# Patient Record
Sex: Female | Born: 1995 | Race: White | Hispanic: No | Marital: Single | Smoking: Never smoker
Health system: Southern US, Community
[De-identification: ages and names within clinical notes are randomized; demographics above are authoritative.]

## PROBLEM LIST (undated history)

## (undated) DIAGNOSIS — F32A Depression, unspecified: Secondary | ICD-10-CM

## (undated) DIAGNOSIS — R55 Syncope and collapse: Secondary | ICD-10-CM

## (undated) DIAGNOSIS — F419 Anxiety disorder, unspecified: Secondary | ICD-10-CM

## (undated) DIAGNOSIS — N200 Calculus of kidney: Secondary | ICD-10-CM

## (undated) DIAGNOSIS — F329 Major depressive disorder, single episode, unspecified: Secondary | ICD-10-CM

## (undated) DIAGNOSIS — N3020 Other chronic cystitis without hematuria: Secondary | ICD-10-CM

## (undated) DIAGNOSIS — N390 Urinary tract infection, site not specified: Secondary | ICD-10-CM

## (undated) HISTORY — DX: Syncope and collapse: R55

## (undated) HISTORY — DX: Major depressive disorder, single episode, unspecified: F32.9

## (undated) HISTORY — DX: Anxiety disorder, unspecified: F41.9

## (undated) HISTORY — DX: Calculus of kidney: N20.0

## (undated) HISTORY — DX: Depression, unspecified: F32.A

## (undated) HISTORY — DX: Urinary tract infection, site not specified: N39.0

## (undated) HISTORY — PX: NO PAST SURGERIES: SHX2092

---

## 2017-04-30 NOTE — Progress Notes (Signed)
05/01/2017 3:47 PM   Christine Farley 1995/11/29 [id number]  Referring provider: [name], MD [address] [place], Christine Farley [id number]  Chief Complaint  Patient presents with  . Recurrent UTI    new patient referred by Dr. Kary Farley    HPI: Patient is a 21 -year-old Caucasian female who is referred to [place] by, Dr. Maryland Farley, for recurrent urinary tract infections.  Patient states that she has had 6 to 8 urinary tract infections over the last year.  She states she had frequent UTI's since the age of 41.    Reviewing her records,  she has had she has not had any documented positive urine cultures.   Her symptoms with a urinary tract infection consist of blood in the urine, dysuria and urgency.  She is drinking 8 eight ounces of water daily.  She is still having dysuria and urgency, but the hematuria has cleared.    Today, she is still frequency, dysuria, incontinence and urgency.  She denies gross hematuria, suprapubic pain, back pain, abdominal pain or flank pain.  She has not had any recent fevers, chills, nausea or vomiting.  Her PVR is 0 mL.    She has a history of nephrolithiasis.  She spontaneously passed the stone.  She was 21 years of age.  She did not have imaging study and never captured a fragment.  She had not had any GU surgery or GU trauma.   She is sexually active.  She has noted a correlation with her urinary tract infections and sexual intercourse.   She does not engage in anal sex.  She is voiding before and after sex.   She is not postmenopausal.   She admits to constipation.  She does use tampons.  She does not engage in good perineal hygiene. She does take tub baths on occasion.    She had incontinence one time.    She is not having pain with bladder filling.    She has not had any recent imaging studies.   She did state she had an ultrasound of her pelvis recently and it was normal.  This is with her gynecologist in [name].    She is not  drinking sodas or juices.     Reviewed referral notes.    PMH: Past Medical History:  Diagnosis Date  . Anxiety   . Depression   . Fainting episodes   . Kidney stone   . Recurrent UTI     Surgical History: History reviewed. No pertinent surgical history.  Home Medications:  Allergies as of 05/01/2017      Reactions   Nsaids Anaphylaxis      Medication List       Accurate as of 05/01/17  3:47 PM. Always use your most recent med list.          ciprofloxacin 250 MG tablet Commonly known as:  CIPRO Take by mouth.   phenazopyridine 200 MG tablet Commonly known as:  PYRIDIUM Take by mouth.   PROAIR HFA 108 (90 Base) MCG/ACT inhaler Generic drug:  albuterol       Allergies:  Allergies  Allergen Reactions  . Nsaids Anaphylaxis    Family History: Family History  Problem Relation Age of Onset  . Kidney cancer Neg Hx   . Bladder Cancer Neg Hx     Social History:  reports that she has never smoked. She has never used smokeless tobacco. She reports that she drinks alcohol. She reports that  she does not use drugs.  ROS: UROLOGY Frequent Urination?: Yes Hard to postpone urination?: Yes Burning/pain with urination?: Yes Get up at night to urinate?: No Leakage of urine?: Yes Urine stream starts and stops?: No Trouble starting stream?: No Do you have to strain to urinate?: No Blood in urine?: No Urinary tract infection?: Yes Sexually transmitted disease?: No Injury to kidneys or bladder?: No Painful intercourse?: No Weak stream?: No Currently pregnant?: No Vaginal bleeding?: No Last menstrual period?: 04/13/2017  Gastrointestinal Nausea?: Yes Vomiting?: No Indigestion/heartburn?: No Diarrhea?: No Constipation?: Yes  Constitutional Fever: No Night sweats?: No Weight loss?: No Fatigue?: Yes  Skin Skin rash/lesions?: No Itching?: No  Eyes Blurred vision?: No Double vision?: No  Ears/Nose/Throat Sore throat?: No Sinus problems?:  No  Hematologic/Lymphatic Swollen glands?: No Easy bruising?: No  Cardiovascular Leg swelling?: No Chest pain?: No  Respiratory Cough?: No Shortness of breath?: Yes  Endocrine Excessive thirst?: No  Musculoskeletal Back pain?: Yes Joint pain?: Yes  Neurological Headaches?: Yes Dizziness?: Yes  Psychologic Depression?: Yes Anxiety?: Yes  Physical Exam: BP 109/68   Pulse 72   Ht 5' 3"  (1.6 m)   Wt 138 lb 12.8 oz (63 kg)   LMP 04/13/2017   BMI 24.59 kg/m   Constitutional: Well nourished. Alert and oriented, No acute distress. HEENT: [place] AT, moist mucus membranes. Trachea midline, no masses. Cardiovascular: No clubbing, cyanosis, or edema. Respiratory: Normal respiratory effort, no increased work of breathing. GI: Abdomen is soft, non tender, non distended, no abdominal masses. Liver and spleen not palpable.  No hernias appreciated.  Stool sample for occult testing is not indicated.   GU: No CVA tenderness.  No bladder fullness or masses.  Normal external genitalia, normal pubic hair distribution, no lesions.  Normal urethral meatus, no lesions, no prolapse, no discharge.   No urethral masses, tenderness and/or tenderness. No bladder fullness, tenderness or masses. Normal vagina mucosa, good estrogen effect, no discharge, no lesions, good pelvic support, no cystocele or rectocele noted.  No cervical motion tenderness.  Uterus is freely mobile and non-fixed.  No adnexal/parametria masses or tenderness noted.  Anus and perineum are without rashes or lesions.    Skin: No rashes, bruises or suspicious lesions. Lymph: No cervical or inguinal adenopathy. Neurologic: Grossly intact, no focal deficits, moving all 4 extremities. Psychiatric: Normal mood and affect.  Pertinent Imaging: Results for Christine, Farley (MRN [id number]) as of 05/01/2017 15:44  Ref. Range 05/01/2017 15:26  Scan Result Unknown 0   I have independently reviewed the films.    Assessment & Plan:    1.  Dysuria  - criteria for recurrent UTI has not been met with 2 or more infections in 6 months or 3 or greater infections in one year   - Patient is instructed to increase their water intake until the urine is pale yellow or clear (10 to 12 cups daily) - patient drinking adequate fluids  - probiotics (yogurt, oral pills or vaginal suppositories), take cranberry pills or drink the juice and Vitamin C 1,000 mg daily to acidify the urine should be added to their daily regimen   - if using tampons, she should remove them prior to urinating and change them often   - avoid soaking in tubs and wipe front to back after urinating   - advised them to have CATH UA's for urinalysis and culture to prevent skin contamination of the specimen  - reviewed symptoms of UTI and advised not to have urine checked or be treated  for UTI if not experiencing symptoms  - discussed antibiotic stewardship with the patient    2. Gross hematuria  - will obtain a RUS to evaluate kidney status  - RTC for report  3. History of nephrolithiasis  - ? Of stone as patient was very young, there were no imaging studies performed and she did not capture a fragment  - RUS pending  4. Constipation  - Patient states that it only occurs during urinary tract infections - I do not have any positive urinary cultures available to me at this time and it is unsure at this time with patient is suffering from recurrent urinary tract infection or some other urinary issues                                              Return for RUS report.  These notes generated with voice recognition software. I apologize for typographical errors.  Zara Council, [facility] [address] [place], [place] [address] [phone]

## 2017-05-01 ENCOUNTER — Telehealth: Payer: Self-pay | Admitting: Urology

## 2017-05-01 ENCOUNTER — Encounter: Payer: Self-pay | Admitting: Urology

## 2017-05-01 ENCOUNTER — Ambulatory Visit (INDEPENDENT_AMBULATORY_CARE_PROVIDER_SITE_OTHER): Payer: BLUE CROSS/BLUE SHIELD | Admitting: Urology

## 2017-05-01 VITALS — BP 109/68 | HR 72 | Ht 63.0 in | Wt 138.8 lb

## 2017-05-01 DIAGNOSIS — R300 Dysuria: Secondary | ICD-10-CM | POA: Diagnosis not present

## 2017-05-01 DIAGNOSIS — R310 Gross hematuria: Secondary | ICD-10-CM

## 2017-05-01 DIAGNOSIS — K5909 Other constipation: Secondary | ICD-10-CM

## 2017-05-01 DIAGNOSIS — Z87442 Personal history of urinary calculi: Secondary | ICD-10-CM

## 2017-05-01 LAB — BLADDER SCAN AMB NON-IMAGING: Scan Result: 0

## 2017-05-01 NOTE — Telephone Encounter (Signed)
Would you send note to Dr. Burnett ShengHedrick?

## 2017-05-13 ENCOUNTER — Ambulatory Visit
Admission: RE | Admit: 2017-05-13 | Discharge: 2017-05-13 | Disposition: A | Discharge status: Home / Self Care | Payer: BLUE CROSS/BLUE SHIELD | Source: Ambulatory Visit | Attending: Urology | Admitting: Urology

## 2017-05-13 DIAGNOSIS — R310 Gross hematuria: Secondary | ICD-10-CM

## 2017-05-22 NOTE — Progress Notes (Signed)
05/23/2017 7:59 AM   Rafael Bihari [birth year] [id number]  Referring provider: No referring provider defined for this encounter.  Chief Complaint  Patient presents with  . Nephrolithiasis  . Dysuria    HPI: 21 yo WF with a history of UTI symptoms who presents today to discuss her RUS results.    Background history Patient is a 33 -year-old Caucasian female who is referred to [place] by, Dr. [name], for recurrent urinary tract infections.  Patient states that she has had 6 to 8 urinary tract infections over the last year.  She states she had frequent UTI's since the age of 40.   Reviewing her records,  she has had she has not had any documented positive urine cultures.  Her symptoms with a urinary tract infection consist of blood in the urine, dysuria and urgency.  She is drinking 8 eight ounces of water daily.  She is still having dysuria and urgency, but the hematuria has cleared.  Today, she is still frequency, dysuria, incontinence and urgency.  She denies gross hematuria, suprapubic pain, back pain, abdominal pain or flank pain.  She has not had any recent fevers, chills, nausea or vomiting.  Her PVR is 0 mL.  She has a history of nephrolithiasis.  She spontaneously passed the stone.  She was 21 years of age.  She did not have imaging study and never captured a fragment.  She had not had any GU surgery or GU trauma.  She is sexually active.  She has noted a correlation with her urinary tract infections and sexual intercourse.   She does not engage in anal sex.  She is voiding before and after sex.   She is not postmenopausal.   She admits to constipation.  She does use tampons.  She does not engage in good perineal hygiene. She does take tub baths on occasion.  She had incontinence one time.  She is not having pain with bladder filling.   She has not had any recent imaging studies.   She did state she had an ultrasound of her pelvis recently and it was normal.  This is with her gynecologist  in [name].   She is not drinking sodas or juices.    She was seen at [facility] urgent care on Monday for gross hematuria, dysuria and low back pain.  UA was positive for 4-10 WBC's and 4-10 RBC's.  Urine culture was positive for mixed urogenital flora.   Currently on Macrobid.    She is still experiencing dysuria and back pain.  She denies fevers, chills, nausea or vomiting.  Her UA today is negative.    RUS performed on 05/14/2017 noted normal ultrasound appearance of both kidneys and the urinary bladder.    PMH: Past Medical History:  Diagnosis Date  . Anxiety   . Depression   . Fainting episodes   . Kidney stone   . Recurrent UTI     Surgical History: History reviewed. No pertinent surgical history.  Home Medications:  Allergies as of 05/23/2017      Reactions   Nsaids Anaphylaxis      Medication List        Accurate as of 05/23/17 11:59 PM. Always use your most recent med list.          nitrofurantoin (macrocrystal-monohydrate) 100 MG capsule Commonly known as:  MACROBID Take 100 mg 2 (two) times daily by mouth.   phenazopyridine 200 MG tablet Commonly known as:  PYRIDIUM Take 200  mg 3 (three) times daily with meals by mouth.   PROAIR HFA 108 (90 Base) MCG/ACT inhaler Generic drug:  albuterol Inhale 2 puffs every 6 (six) hours as needed into the lungs.       Allergies:  Allergies  Allergen Reactions  . Nsaids Anaphylaxis    Family History: Family History  Problem Relation Age of Onset  . Kidney cancer Neg Hx   . Bladder Cancer Neg Hx     Social History:  reports that  has never smoked. she has never used smokeless tobacco. She reports that she drinks alcohol. She reports that she does not use drugs.  ROS: UROLOGY Frequent Urination?: No Hard to postpone urination?: No Burning/pain with urination?: Yes Get up at night to urinate?: No Leakage of urine?: No Urine stream starts and stops?: No Trouble starting stream?: No Do you have to strain to urinate?:  No Blood in urine?: No Urinary tract infection?: Yes Sexually transmitted disease?: No Injury to kidneys or bladder?: No Painful intercourse?: No Weak stream?: No Currently pregnant?: No Vaginal bleeding?: No  Gastrointestinal Nausea?: No Vomiting?: No Indigestion/heartburn?: No Diarrhea?: No Constipation?: No  Constitutional Fever: No Night sweats?: No Weight loss?: No Fatigue?: No  Skin Skin rash/lesions?: No Itching?: No  Eyes Blurred vision?: No Double vision?: No  Ears/Nose/Throat Sore throat?: No Sinus problems?: No  Hematologic/Lymphatic Swollen glands?: No Easy bruising?: No  Cardiovascular Leg swelling?: No Chest pain?: No  Respiratory Cough?: No Shortness of breath?: No  Endocrine Excessive thirst?: No  Musculoskeletal Back pain?: Yes Joint pain?: No  Neurological Headaches?: No Dizziness?: No  Psychologic Depression?: No Anxiety?: No  Physical Exam: BP 115/68   Pulse 71   Ht 5' 3"  (1.6 m)   Wt 137 lb 4.8 oz (62.3 kg)   BMI 24.32 kg/m   Constitutional: Well nourished. Alert and oriented, No acute distress. HEENT: [place] AT, moist mucus membranes. Trachea midline, no masses. Cardiovascular: No clubbing, cyanosis, or edema. Respiratory: Normal respiratory effort, no increased work of breathing. GI: Abdomen is soft, non tender, non distended, no abdominal masses. Liver and spleen not palpable.  No hernias appreciated.  Stool sample for occult testing is not indicated.   GU: No CVA tenderness.  No bladder fullness or masses.  Normal external genitalia, normal pubic hair distribution, no lesions.  Normal urethral meatus, no lesions, no prolapse, no discharge.   No urethral masses, tenderness and/or tenderness. No bladder fullness, tenderness or masses. Normal vagina mucosa, good estrogen effect, no discharge, no lesions, good pelvic support, no cystocele or rectocele noted.  No cervical motion tenderness.  Uterus is freely mobile and  non-fixed.  No adnexal/parametria masses or tenderness noted.  Anus and perineum are without rashes or lesions.    Skin: No rashes, bruises or suspicious lesions. Lymph: No cervical or inguinal adenopathy. Neurologic: Grossly intact, no focal deficits, moving all 4 extremities. Psychiatric: Normal mood and affect.  Pertinent Imaging: Results for SALAYA, HOLTROP (MRN [id number]) as of 05/01/2017 15:44  Ref. Range 05/01/2017 15:26  Scan Result Unknown 0   CLINICAL DATA:  21 year old female with gross hematuria, frequent urinary tract infection.  EXAM: RENAL / URINARY TRACT ULTRASOUND COMPLETE  COMPARISON:  None.  FINDINGS: Right Kidney:  Length: 10.6 cm. Echogenicity within normal limits. No mass or hydronephrosis visualized.  Left Kidney:  Length: 10.5 cm. Echogenicity within normal limits. No mass or hydronephrosis visualized.  Bladder:  Appears normal for degree of bladder distention. Both ureteral jets detected with Doppler.  IMPRESSION: Normal  ultrasound appearance of both kidneys and the urinary bladder.   Electronically Signed   By: Genevie Ann M.D.   On: 05/14/2017 07:58  I have independently reviewed the films.    Assessment & Plan:    1. Dysuria  - criteria for recurrent UTI has not been met with 2 or more infections in 6 months or 3 or greater infections in one year   - Patient is instructed to increase their water intake until the urine is pale yellow or clear (10 to 12 cups daily) - patient drinking adequate fluids  - probiotics (yogurt, oral pills or vaginal suppositories), take cranberry pills or drink the juice and Vitamin C 1,000 mg daily to acidify the urine should be added to their daily regimen   - if using tampons, she should remove them prior to urinating and change them often   - avoid soaking in tubs and wipe front to back after urinating   - advised them to have CATH UA's for urinalysis and culture to prevent skin contamination of the  specimen  - reviewed symptoms of UTI and advised not to have urine checked or be treated for UTI if not experiencing symptoms  - discussed antibiotic stewardship with the patient    - urine sent for repeat culture as patient is still having symptoms   - GC/Chlamydia cultures are sent  2. Gross hematuria  - RUS did not demonstrate an etiology for her hematuria or other urinary symptoms   3. History of nephrolithiasis  - no stone or hydronephrosis seen on RUS  4. Constipation  - Patient states that it only occurs during urinary tract infections - I do not have any positive urinary cultures available to me at this time and it is unsure at this time with patient is suffering from recurrent urinary tract infection or some other urinary issues                                   Return for pending cultures.  These notes generated with voice recognition software. I apologize for typographical errors.  Zara Council, [facility] [address] [place], [place] [address] [phone]

## 2017-05-23 ENCOUNTER — Ambulatory Visit (INDEPENDENT_AMBULATORY_CARE_PROVIDER_SITE_OTHER): Payer: BLUE CROSS/BLUE SHIELD | Admitting: Urology

## 2017-05-23 ENCOUNTER — Encounter: Payer: Self-pay | Admitting: Urology

## 2017-05-23 VITALS — BP 115/68 | HR 71 | Ht 63.0 in | Wt 137.3 lb

## 2017-05-23 DIAGNOSIS — K5909 Other constipation: Secondary | ICD-10-CM | POA: Diagnosis not present

## 2017-05-23 DIAGNOSIS — R310 Gross hematuria: Secondary | ICD-10-CM

## 2017-05-23 DIAGNOSIS — R300 Dysuria: Secondary | ICD-10-CM | POA: Diagnosis not present

## 2017-05-23 DIAGNOSIS — Z87442 Personal history of urinary calculi: Secondary | ICD-10-CM

## 2017-05-24 LAB — URINALYSIS, COMPLETE
BILIRUBIN UA: NEGATIVE
Glucose, UA: NEGATIVE
KETONES UA: NEGATIVE
Nitrite, UA: NEGATIVE
PROTEIN UA: NEGATIVE
RBC UA: NEGATIVE
SPEC GRAV UA: 1.01 (ref 1.005–1.030)
UUROB: 0.2 mg/dL (ref 0.2–1.0)
pH, UA: 6 (ref 5.0–7.5)

## 2017-05-24 LAB — MICROSCOPIC EXAMINATION: RBC, UA: NONE SEEN /hpf (ref 0–?)

## 2017-05-26 LAB — CULTURE, URINE COMPREHENSIVE

## 2017-05-26 LAB — GC/CHLAMYDIA PROBE AMP
Chlamydia trachomatis, NAA: NEGATIVE
Neisseria gonorrhoeae by PCR: NEGATIVE

## 2017-05-29 ENCOUNTER — Telehealth: Payer: Self-pay | Admitting: Family Medicine

## 2017-05-29 MED ORDER — OXYBUTYNIN CHLORIDE ER 10 MG PO TB24: 10.0000 mg | ORAL_TABLET | Freq: Every day | ORAL | 3 refills | Status: AC

## 2017-05-29 NOTE — Telephone Encounter (Signed)
I am hoping the Ditropan will relieve her symptoms of frequency, dysuria, incontinence and urgency.

## 2017-05-29 NOTE — Telephone Encounter (Signed)
Left message for patient to pick up medication from pharmacy and to call back to schedule [facility].

## 2017-05-29 NOTE — Telephone Encounter (Signed)
Spoke with pt in reference to ucx results and ditropan. Pt inquired about why she needs the ditropan. I did not see in dictation. Please advise.

## 2017-05-29 NOTE — Telephone Encounter (Signed)
-----   Message from Harle BattiestShannon A McGowan, PA-C sent at 05/28/2017 10:16 PM EST ----- Please let Christine Farley know that her urine cultures were negative.  I would like her to start on Ditropan XL 10 mg daily.  I will need to see her back in one month.

## 2017-05-30 ENCOUNTER — Telehealth: Payer: Self-pay | Admitting: Urology

## 2017-05-30 NOTE — Telephone Encounter (Signed)
Spoke with pt in reference to taking ditropan for frequency, urgency, dysuria, and incontinence. Pt then stated that she still doesn't understand what the medicine is for. Once again reinforced with pt the purpose of medication and her reason for [facility]. Pt stated that she doesn't have any issues unless she has a UTI. Once again reinforced with pt that she does not/did not bring UTI documentation with her to appt therefore [facility] is tx off of her c/o s/s. Pt stated that she would call back for an appt.

## 2017-05-30 NOTE — Telephone Encounter (Signed)
Left message for patient to call back so that I could answer her questions.

## 2017-06-17 ENCOUNTER — Other Ambulatory Visit: Payer: Self-pay

## 2017-06-19 ENCOUNTER — Encounter: Payer: Self-pay | Admitting: Urology

## 2017-06-19 ENCOUNTER — Ambulatory Visit (INDEPENDENT_AMBULATORY_CARE_PROVIDER_SITE_OTHER): Payer: BLUE CROSS/BLUE SHIELD | Admitting: Urology

## 2017-06-19 VITALS — BP 129/75 | HR 67 | Ht 63.0 in | Wt 130.0 lb

## 2017-06-19 DIAGNOSIS — R310 Gross hematuria: Secondary | ICD-10-CM | POA: Diagnosis not present

## 2017-06-19 DIAGNOSIS — R300 Dysuria: Secondary | ICD-10-CM

## 2017-06-19 DIAGNOSIS — Z87442 Personal history of urinary calculi: Secondary | ICD-10-CM

## 2017-06-19 MED ORDER — CEPHALEXIN 250 MG PO CAPS: ORAL_CAPSULE | ORAL | 0 refills | Status: AC

## 2017-06-19 NOTE — Progress Notes (Addendum)
06/19/2017 5:27 PM   Christine OrleansLexi B Mcnulty 21-Jul-1995 [id number]  Referring provider: No referring provider defined for this encounter.  Chief Complaint  Patient presents with  . Dysuria  . Nephrolithiasis    HPI: 21 yo WF with a history of UTI symptoms who presents today for follow up after being seen at Urgent care for UTI.    Background history Patient is a 21 -year-old Caucasian female who is referred to us by, Dr. Jerl MinaJames Hedrick, for recurrent urinary tract infections.  Patient states that she has had 6 to 8 urinary tract infections over the last year.  She states she had frequent UTI's since the age of 89.   Reviewing her records,  she has had she has not had any documented positive urine cultures.  Her symptoms with a urinary tract infection consist of blood in the urine, dysuria and urgency.  She is drinking 8 eight ounces of water daily.  She is still having dysuria and urgency, but the hematuria has cleared.  Today, she is still frequency, dysuria, incontinence and urgency.  She denies gross hematuria, suprapubic pain, back pain, abdominal pain or flank pain.  She has not had any recent fevers, chills, nausea or vomiting.  Her PVR is 0 mL.  She has a history of nephrolithiasis.  She spontaneously passed the stone.  She was 21 years of age.  She did not have imaging study and never captured a fragment.  She had not had any GU surgery or GU trauma.  She is sexually active.  She has noted a correlation with her urinary tract infections and sexual intercourse.   She does not engage in anal sex.  She is voiding before and after sex.   She is not postmenopausal.   She admits to constipation.  She does use tampons.  She does not engage in good perineal hygiene. She does take tub baths on occasion.  She had incontinence one time.  She is not having pain with bladder filling.   She has not had any recent imaging studies.   She did state she had an ultrasound of her pelvis recently and it was normal.   This is with her gynecologist in [place].   She is not drinking sodas or juices.    [facility]/2017 for symptoms of gross hematuria, dysuria and low back pain.  Placed on Septra.  Urine culture was positive for mixed urogenital flora.   She was seen [facility] for symptoms of dysuria.  She was placed on Cipro.  UA was positive for 4-10 WBC's, 4-10 RBC and a few bacteria.  Urine culture was negative.   She was seen again on 05/20/2017 for hematuria and low back pain.  Stared on Macrobid.  UA was positive for 4-10 WBC's and 4-10 RBC's.  Her urine culture was negative.   She then [facility] for increased frequency, urgency and suprapubic pain.  UA was nitrite positive, 10-50 WBC's, 4-10 RBC's and many bacteria.  Urine culture was negative.    Her UA, GC and Chlamydia were negative on 05/23/2017.   RUS performed on 05/14/2017 noted normal ultrasound appearance of both kidneys and the urinary bladder.  Today, she is experiencing frequency, urgency, dysuria and nocturia.  She is not having gross hematuria, suprapubic pain or flank pain.  She is not having fevers, chills, nausea or vomiting.  Her CATH UA in our  office is positive for 11-30 WBC's.  She will be leaving for Christmas break in two weeks and then out of the country in late February for four months.      PMH: Past Medical History:  Diagnosis Date  . Anxiety   . Depression   . Fainting episodes   . Kidney stone   . Recurrent UTI     Surgical History: Past Surgical History:  Procedure Laterality Date  . NO PAST SURGERIES      Home Medications:  Allergies as of 06/19/2017      Reactions   Nsaids Anaphylaxis      Medication List        Accurate as of 06/19/17 11:59 PM. Always use your most recent med list.          cephALEXin 250 MG capsule Commonly known as:  KEFLEX Take one capsule after intercourse   oxybutynin 10 MG 24 hr tablet Commonly  known as:  DITROPAN-XL Take 1 tablet (10 mg total) daily by mouth.   PROAIR HFA 108 (90 Base) MCG/ACT inhaler Generic drug:  albuterol Inhale 2 puffs every 6 (six) hours as needed into the lungs.       Allergies:  Allergies  Allergen Reactions  . Nsaids Anaphylaxis    Family History: Family History  Problem Relation Age of Onset  . Healthy Father   . Healthy Mother   . Kidney cancer Neg Hx   . Bladder Cancer Neg Hx     Social History:  reports that  has never smoked. she has never used smokeless tobacco. She reports that she drinks alcohol. She reports that she does not use drugs.  ROS: UROLOGY Frequent Urination?: Yes Hard to postpone urination?: Yes Burning/pain with urination?: Yes Get up at night to urinate?: Yes Leakage of urine?: No Urine stream starts and stops?: No Trouble starting stream?: No Do you have to strain to urinate?: No Blood in urine?: No Urinary tract infection?: No Sexually transmitted disease?: No Injury to kidneys or bladder?: No Painful intercourse?: No Weak stream?: No Currently pregnant?: No Vaginal bleeding?: No Last menstrual period?: 06/11/17  Gastrointestinal Nausea?: No Vomiting?: No Indigestion/heartburn?: No Diarrhea?: No Constipation?: No  Constitutional Fever: No Night sweats?: No Weight loss?: No Fatigue?: No  Skin Skin rash/lesions?: No Itching?: No  Eyes Blurred vision?: No Double vision?: No  Ears/Nose/Throat Sore throat?: No Sinus problems?: No  Hematologic/Lymphatic Swollen glands?: No Easy bruising?: No  Cardiovascular Leg swelling?: No Chest pain?: No  Respiratory Cough?: No Shortness of breath?: No  Endocrine Excessive thirst?: Yes  Musculoskeletal Back pain?: No Joint pain?: No  Neurological Headaches?: No Dizziness?: No  Psychologic Depression?: No Anxiety?: No  Physical Exam: BP 129/75   Pulse 67   Ht 5\' 3"  (1.6 m)   Wt 130 lb (59 kg)   LMP 06/10/2017 (Exact Date)    BMI 23.03 kg/m   Constitutional: Well nourished. Alert and oriented, No acute distress. HEENT: [place] AT, moist mucus membranes. Trachea midline, no masses. Cardiovascular: No clubbing, cyanosis, or edema. Respiratory: Normal respiratory effort, no increased work of breathing. Skin: No rashes, bruises or suspicious lesions. Lymph: No cervical or inguinal adenopathy. Neurologic: Grossly intact, no focal deficits, moving all 4 extremities. Psychiatric: Normal mood and affect.  Laboratory Data Urinalysis CATH UA was positive for 11-30 WBC's.  See Epic.   Pertinent Imaging: Results for Christine Farley, Christine Farley (MRN [id number]) as of 05/01/2017 15:44  Ref. Range 05/01/2017 15:26  Scan Result Unknown 0   CLINICAL  DATA:  21 year old female with gross hematuria, frequent urinary tract infection.  EXAM: RENAL / URINARY TRACT ULTRASOUND COMPLETE  COMPARISON:  None.  FINDINGS: Right Kidney:  Length: 10.6 cm. Echogenicity within normal limits. No mass or hydronephrosis visualized.  Left Kidney:  Length: 10.5 cm. Echogenicity within normal limits. No mass or hydronephrosis visualized.  Bladder:  Appears normal for degree of bladder distention. Both ureteral jets detected with Doppler.  IMPRESSION: Normal ultrasound appearance of both kidneys and the urinary bladder.   Electronically Signed   By: Odessa Fleming M.D.   On: 05/14/2017 07:58  I have independently reviewed the films.    Assessment & Plan:    1. Dysuria  - UA is suspicious for infection - will send for culture  - Keflex is given for post coital prophylaxis.    2. Gross hematuria  - RUS did not demonstrate an etiology for her hematuria or other urinary symptoms  - Patient has experienced hematuria with negative cultures -continues to have UTI symptoms  - obtain CT Renal stone study as RUS can miss stones  3. History of nephrolithiasis  - no stone or hydronephrosis seen on RUS  - CT Renal stone study pending                                     Return for I will call patient with results.  These notes generated with voice recognition software. I apologize for typographical errors.  Cloretta Ned  [facility] [address], [address] [place], [place] [address] [phone]  Addendum:   Patient and her father, who is a physician, did not want her to have a CT scan.  KUB and RUS were negative.   Currently on post-coital antibiotic but still having UTI symptoms after intercourse.  She will be scheduled for a cystoscopy for further evaluation of her pain.

## 2017-06-20 LAB — URINALYSIS, COMPLETE
BILIRUBIN UA: NEGATIVE
Glucose, UA: NEGATIVE
Ketones, UA: NEGATIVE
Nitrite, UA: NEGATIVE
PH UA: 7 (ref 5.0–7.5)
PROTEIN UA: NEGATIVE
Specific Gravity, UA: 1.01 (ref 1.005–1.030)
Urobilinogen, Ur: 0.2 mg/dL (ref 0.2–1.0)

## 2017-06-20 LAB — MICROSCOPIC EXAMINATION: EPITHELIAL CELLS (NON RENAL): NONE SEEN /HPF (ref 0–10)

## 2017-06-21 ENCOUNTER — Telehealth: Payer: Self-pay

## 2017-06-21 MED ORDER — CEPHALEXIN 250 MG PO CAPS: ORAL_CAPSULE | ORAL | 0 refills | Status: DC

## 2017-06-21 NOTE — Telephone Encounter (Signed)
LMOM- telling pt to call [place] office back. Abx sent to pharmacy.

## 2017-06-21 NOTE — Telephone Encounter (Signed)
-----   Message from Harle BattiestShannon A McGowan, PA-C sent at 06/21/2017 10:37 AM EST ----- Please let Christine Farley know that her preliminary urine culture result is positive for infection.  If she wants to start the antibiotic at this time that would be fine.  If she chooses to start the antibiotic, it the Keflex 250mg .  She will need to take two tablets in the am and pm for the therapeutic dose.  The 250 mg you take after sexual intercourse is for prevention.

## 2017-06-21 NOTE — Telephone Encounter (Signed)
Patient notified

## 2017-06-22 LAB — CULTURE, URINE COMPREHENSIVE

## 2017-06-24 ENCOUNTER — Other Ambulatory Visit: Payer: Self-pay | Admitting: Urology

## 2017-06-25 ENCOUNTER — Telehealth: Payer: Self-pay | Admitting: Urology

## 2017-06-25 ENCOUNTER — Telehealth: Payer: Self-pay

## 2017-06-25 MED ORDER — NITROFURANTOIN MONOHYD MACRO 100 MG PO CAPS: 100.0000 mg | ORAL_CAPSULE | Freq: Two times a day (BID) | ORAL | 0 refills | Status: AC

## 2017-06-25 NOTE — Telephone Encounter (Signed)
LMOM- +ucx. Requested a return call in reference to picking up medication.

## 2017-06-25 NOTE — Telephone Encounter (Signed)
-----   Message from Harle BattiestShannon A McGowan, PA-C sent at 06/24/2017 10:08 AM EST ----- Please let Bambi know that her urine culture was positive for infection.  She needs to stop the Keflex as the bacteria is resistant to the antibiotic.  We will start Macrobid 100 mg, bid x seven day.

## 2017-06-25 NOTE — Telephone Encounter (Signed)
Pt returned call. Explained to pt the need of new abx. Pt voiced understanding.

## 2017-06-25 NOTE — Telephone Encounter (Signed)
LMOM to call back

## 2017-06-26 ENCOUNTER — Telehealth: Payer: Self-pay | Admitting: Urology

## 2017-06-26 NOTE — Telephone Encounter (Signed)
Would you check on how quickly we can get her CT done?

## 2017-06-26 NOTE — Telephone Encounter (Signed)
I just got it approved yesterday, sent scheduling a message to call her today. Yesterday when we checked they are booked out until the 20th? But they will call her to schedule   Christine DusterMichelle

## 2017-07-23 ENCOUNTER — Ambulatory Visit: Admission: RE | Admit: 2017-07-23 | Payer: BLUE CROSS/BLUE SHIELD | Source: Ambulatory Visit

## 2017-07-31 ENCOUNTER — Telehealth: Payer: Self-pay | Admitting: Urology

## 2017-07-31 ENCOUNTER — Other Ambulatory Visit: Payer: Self-pay | Admitting: Urology

## 2017-07-31 DIAGNOSIS — R109 Unspecified abdominal pain: Secondary | ICD-10-CM

## 2017-07-31 NOTE — Telephone Encounter (Signed)
Patient called and said she wants to speak with you about the CT scan. Her dad does not want her to get radiation from A CT scan he thinks she needs an MRI instead? The patient wants to discuss her options. Does she need an appointment?  Christine DusterMichelle

## 2017-07-31 NOTE — Progress Notes (Signed)
KUB order is in .  

## 2017-07-31 NOTE — Telephone Encounter (Signed)
LMOM to call me back

## 2017-08-01 ENCOUNTER — Ambulatory Visit
Admission: RE | Admit: 2017-08-01 | Discharge: 2017-08-01 | Disposition: A | Discharge status: Home / Self Care | Payer: BLUE CROSS/BLUE SHIELD | Source: Ambulatory Visit | Attending: Urology | Admitting: Urology

## 2017-08-01 DIAGNOSIS — R109 Unspecified abdominal pain: Secondary | ICD-10-CM | POA: Diagnosis not present

## 2017-08-01 DIAGNOSIS — Z975 Presence of (intrauterine) contraceptive device: Secondary | ICD-10-CM | POA: Insufficient documentation

## 2017-08-02 ENCOUNTER — Telehealth: Payer: Self-pay

## 2017-08-02 ENCOUNTER — Other Ambulatory Visit: Payer: Self-pay | Admitting: Urology

## 2017-08-02 DIAGNOSIS — R102 Pelvic and perineal pain: Secondary | ICD-10-CM

## 2017-08-02 NOTE — Telephone Encounter (Signed)
-----   Message from Harle BattiestShannon A McGowan, PA-C sent at 08/02/2017  8:05 AM EST ----- I have left a message for Christine Farley regarding her x-ray results.  I will try to get her to see PT prior to her leaving for [place].  I did offer to reschedule the CT if she wanted to go through with the study.

## 2017-08-05 ENCOUNTER — Encounter: Payer: Self-pay | Admitting: Urology

## 2017-08-05 ENCOUNTER — Telehealth: Payer: Self-pay | Admitting: Urology

## 2017-08-05 ENCOUNTER — Ambulatory Visit (INDEPENDENT_AMBULATORY_CARE_PROVIDER_SITE_OTHER): Payer: BLUE CROSS/BLUE SHIELD | Admitting: Urology

## 2017-08-05 VITALS — BP 117/76 | HR 62 | Ht 63.0 in | Wt 133.0 lb

## 2017-08-05 DIAGNOSIS — R310 Gross hematuria: Secondary | ICD-10-CM

## 2017-08-05 DIAGNOSIS — N3020 Other chronic cystitis without hematuria: Secondary | ICD-10-CM

## 2017-08-05 LAB — URINALYSIS, COMPLETE
Bilirubin, UA: NEGATIVE
GLUCOSE, UA: NEGATIVE
KETONES UA: NEGATIVE
Leukocytes, UA: NEGATIVE
NITRITE UA: NEGATIVE
Protein, UA: NEGATIVE
SPEC GRAV UA: 1.02 (ref 1.005–1.030)
UUROB: 0.2 mg/dL (ref 0.2–1.0)
pH, UA: 6 (ref 5.0–7.5)

## 2017-08-05 LAB — MICROSCOPIC EXAMINATION

## 2017-08-05 MED ORDER — CIPROFLOXACIN HCL 500 MG PO TABS
500.0000 mg | ORAL_TABLET | Freq: Once | ORAL | Status: AC
  Administered 2017-08-05: 500 mg via ORAL

## 2017-08-05 MED ORDER — TRIMETHOPRIM 100 MG PO TABS: 100.0000 mg | ORAL_TABLET | Freq: Every day | ORAL | 11 refills | Status: AC

## 2017-08-05 MED ORDER — LIDOCAINE HCL 2 % EX GEL
1.0000 "application " | Freq: Once | CUTANEOUS | Status: AC
  Administered 2017-08-05: 1 via URETHRAL

## 2017-08-05 NOTE — Telephone Encounter (Signed)
Pt advised to make 6 month return appt. At checkout pt was very tearful and refused to make follow up appt stating that she will be out of town and will make appt when she returns. Just FYI as to why follow up appt wasn't made. Thanks

## 2017-08-05 NOTE — Progress Notes (Signed)
08/05/2017 10:16 AM   Danae Orleans 1996/06/10 [id number]  Referring provider: No referring provider defined for this encounter.  Chief Complaint  Patient presents with  . Cysto    HPI: Carollee Herter: The patient has a history of recurrent urinary tract infections.  A number of times she has gone to urgent care with cystitis-like symptoms and has had negative cultures.  In October 2018 she had a normal renal ultrasound.  She has had blood in the urine with episodes.  It appears that a CAT scan was ordered  Today On December 5 she had a positive culture and on May 23, 2017 the culture was negative.   The patient did not have a CAT scan but had a KUB.  They were trying to eliminate exposure to radiation.  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  Bladder mucosa and trigone were normal.  There is no cystitis.  There is no foreign body.  There is no carcinoma.  There is no pain with bladder filling   PMH: Past Medical History:  Diagnosis Date  . Anxiety   . Depression   . Fainting episodes   . Kidney stone   . Recurrent UTI     Surgical History: Past Surgical History:  Procedure Laterality Date  . NO PAST SURGERIES      Home Medications:  Allergies as of 08/05/2017      Reactions   Nsaids Anaphylaxis      Medication List        Accurate as of 08/05/17 10:16 AM. Always use your most recent med list.          cephALEXin 250 MG capsule Commonly known as:  KEFLEX Take one capsule after intercourse   nitrofurantoin (macrocrystal-monohydrate) 100 MG capsule Commonly known as:  MACROBID Take 1 capsule (100 mg total) by mouth every 12 (twelve) hours.   oxybutynin 10 MG 24 hr tablet Commonly known as:  DITROPAN-XL Take 1 tablet (10 mg total) daily by mouth.   PROAIR HFA  108 (90 Base) MCG/ACT inhaler Generic drug:  albuterol Inhale 2 puffs every 6 (six) hours as needed into the lungs.       Allergies:  Allergies  Allergen Reactions  . Nsaids Anaphylaxis    Family History: Family History  Problem Relation Age of Onset  . Healthy Father   . Healthy Mother   . Kidney cancer Neg Hx   . Bladder Cancer Neg Hx     Social History:  reports that  has never smoked. she has never used smokeless tobacco. She reports that she drinks alcohol. She reports that she does not use drugs.  ROS:                                        Physical Exam: BP 117/76   Pulse 62   Ht 5\' 3"  (1.6 m)   Wt 133 lb (60.3 kg)   BMI 23.56 kg/m   Constitutional:  Alert and oriented, No acute distress.   Laboratory Data: No results found for: WBC, HGB, HCT, MCV, PLT  No results found for: CREATININE  No results found for: PSA  No results found for: TESTOSTERONE  No results found for: HGBA1C  Urinalysis  Component Value Date/Time   APPEARANCEUR Cloudy (A) 06/19/2017 1617   GLUCOSEU Negative 06/19/2017 1617   BILIRUBINUR Negative 06/19/2017 1617   PROTEINUR Negative 06/19/2017 1617   NITRITE Negative 06/19/2017 1617   LEUKOCYTESUR 3+ (A) 06/19/2017 1617    Pertinent Imaging: As aboe  Assessment & Plan: The patient has a long history of bladder infections dating back to age 89.  I think there is enough proof with positive cultures to discuss and recommend urinary prophylaxis and probiotics.  She should get urine cultures on prophylaxis.  My index of suspicion at this stage is lower that she has interstitial cystitis.  The possibility was briefly discussed.  I do not believe she needs more upper tract imaging and she has been cleared for blood in the urine based upon age and risk factors and workup  The patient was a bit upset today and I can empathize with her.  I did not mention hydrodistention but she understands the concept of getting  urine cultures for flareups while she is in [place] on daily suppression therapy of trimethoprim 100 mg.  I will see her back in 6 months when she returns  1. Gross hematuria 2.  Recurrent urinary tract infections - Urinalysis, Complete - ciprofloxacin (CIPRO) tablet 500 mg - lidocaine (XYLOCAINE) 2 % jelly 1 application   No Follow-up on file.  Martina SinnerMACDIARMID,Lenix Benoist A, MD  [facility] [address], [address] [place], [place] [address] [phone]

## 2017-08-06 ENCOUNTER — Telehealth: Payer: Self-pay | Admitting: Urology

## 2017-08-06 NOTE — Telephone Encounter (Signed)
Pt called office to let Carollee HerterShannon know pt feels like she has and infection even tho they gave her abx before cysto. Pt wants to speak to Shannon/Nurse to let them know about this and get some answers/guidance about what's going on. Please advise. Thanks.

## 2017-08-07 NOTE — Telephone Encounter (Signed)
Please give pt a call [phone]

## 2017-08-07 NOTE — Telephone Encounter (Signed)
LMOM

## 2017-08-13 NOTE — Telephone Encounter (Signed)
Several messages have been left for pt.

## 2018-09-05 ENCOUNTER — Encounter: Payer: Self-pay | Admitting: Emergency Medicine

## 2018-09-05 ENCOUNTER — Emergency Department
Admission: EM | Admit: 2018-09-05 | Discharge: 2018-09-05 | Disposition: A | Discharge status: Home / Self Care | Payer: BLUE CROSS/BLUE SHIELD | Attending: Emergency Medicine | Admitting: Emergency Medicine

## 2018-09-05 ENCOUNTER — Other Ambulatory Visit: Payer: Self-pay

## 2018-09-05 DIAGNOSIS — T7840XA Allergy, unspecified, initial encounter: Secondary | ICD-10-CM | POA: Insufficient documentation

## 2018-09-05 DIAGNOSIS — R109 Unspecified abdominal pain: Secondary | ICD-10-CM | POA: Diagnosis present

## 2018-09-05 MED ORDER — FAMOTIDINE 20 MG PO TABS
40.0000 mg | ORAL_TABLET | Freq: Once | ORAL | Status: AC
Start: 2018-09-05 — End: 2018-09-05
  Administered 2018-09-05: 40 mg via ORAL
  Filled 2018-09-05: qty 2

## 2018-09-05 MED ORDER — EPINEPHRINE 0.3 MG/0.3ML IJ SOAJ
0.3000 mg | Freq: Once | INTRAMUSCULAR | Status: AC
  Administered 2018-09-05: 0.3 mg via INTRAMUSCULAR
  Filled 2018-09-05: qty 0.3

## 2018-09-05 MED ORDER — DIPHENHYDRAMINE HCL 50 MG PO TABS: 50.0000 mg | ORAL_TABLET | Freq: Four times a day (QID) | ORAL | 0 refills | Status: AC | PRN

## 2018-09-05 MED ORDER — SODIUM CHLORIDE 0.9 % IV BOLUS
1000.0000 mL | Freq: Once | INTRAVENOUS | Status: AC
  Administered 2018-09-05: 1000 mL via INTRAVENOUS

## 2018-09-05 MED ORDER — PREDNISONE 20 MG PO TABS
60.0000 mg | ORAL_TABLET | Freq: Once | ORAL | Status: AC
  Administered 2018-09-05: 60 mg via ORAL
  Filled 2018-09-05: qty 3

## 2018-09-05 MED ORDER — DIPHENHYDRAMINE HCL 25 MG PO CAPS
25.0000 mg | ORAL_CAPSULE | Freq: Once | ORAL | Status: AC
  Administered 2018-09-05: 25 mg via ORAL
  Filled 2018-09-05: qty 1

## 2018-09-05 MED ORDER — EPINEPHRINE 0.3 MG/0.3ML IJ SOAJ: 0.3000 mg | INTRAMUSCULAR | 0 refills | Status: AC | PRN

## 2018-09-05 MED ORDER — FAMOTIDINE 20 MG PO TABS: 20.0000 mg | ORAL_TABLET | Freq: Two times a day (BID) | ORAL | 0 refills | Status: AC

## 2018-09-05 MED ORDER — ALBUTEROL SULFATE HFA 108 (90 BASE) MCG/ACT IN AERS: 2.0000 | INHALATION_SPRAY | RESPIRATORY_TRACT | 0 refills | Status: AC | PRN

## 2018-09-05 MED ORDER — PREDNISONE 20 MG PO TABS: 60.0000 mg | ORAL_TABLET | Freq: Every day | ORAL | 0 refills | Status: AC

## 2018-09-05 NOTE — Discharge Instructions (Signed)
Please make sure that you establish primary care physician in the local area who can help you with your chronic illnesses.  Do not travel without your EpiPen, as it is a lifesaving medicine.  Take an entire 5 days of prednisone and Pepcid, and Benadryl as needed, for your symptoms.  Turn to the emergency department if you develop swelling of the face, worsening rash, shortness of breath, inability to swallow, wheezing, or any other symptoms concerning to you.

## 2018-09-05 NOTE — ED Notes (Signed)
Pt given warm blankets. Denies any other needs. Friends at bedside.

## 2018-09-05 NOTE — ED Provider Notes (Addendum)
[facility] Emergency Department Provider Note  ____________________________________________  Time seen: Approximately 6:37 PM  I have reviewed the triage vital signs and the nursing notes.   HISTORY  Chief Complaint Allergic Reaction    HPI Christine Farley is a 23 y.o. 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  The patient reports that she ate bread with sesame oil at 3 PM.  Shortly after, she developed a pain in her stomach typical of her prior allergic reactions, and then noted a warm flushed feeling to her face.  She took 50 mg of Benadryl and Zyrtec, and afterwards developed coughing spasms and wheezing.  Since then, her respiratory symptoms have resolved, which she attributes to the Benadryl.  She denies any swelling of the tongue or face, difficulty swallowing.  Past Medical History:  Diagnosis Date  . Anxiety   . Depression   . Fainting episodes   . Kidney stone   . Recurrent UTI     There are no active problems to display for this patient.   Past Surgical History:  Procedure Laterality Date  . NO PAST SURGERIES      Current Outpatient Rx  . Order #: [id number] Class: Print  . Order #: [id number] Class: Normal  . Order #: [id number] Class: Print  . Order #: [id number] Class: Print  . Order #: [id number] Class: Print  . Order #: [id number] Class: Normal  . Order #: [id number] Class: Normal  . Order #: [id number] Class: Print  . Order #: [id number] Class: Normal    Allergies Nsaids  Family History  Problem Relation Age of Onset  . Healthy Father   . Healthy Mother   . Kidney cancer Neg Hx   . Bladder Cancer Neg Hx     Social History Social History   Tobacco Use  . Smoking status: Never Smoker  . Smokeless tobacco: Never Used  Substance Use Topics  . Alcohol use: Yes    Comment: Occasional  . Drug use: No    Review of Systems Constitutional: No fever/chills.  No  lightheadedness or syncope. Eyes: No visual changes. ENT: No sore throat. No congestion or rhinorrhea.  No hoarse voice.  No drooling.  No difficulty swallowing.  No facial or mouth swelling. Cardiovascular: Denies chest pain. Denies palpitations. Respiratory: Positive coughing spasms with wheezing.  Denies shortness of breath.  No cough. Gastrointestinal: Positive central abdominal pain, now resolved.  No nausea, no vomiting.  No diarrhea.  No constipation. Genitourinary: Negative for dysuria. Musculoskeletal: Negative for back pain. Skin: Negative for rash. Neurological: Negative for headaches. No focal numbness, tingling or weakness.     ____________________________________________   PHYSICAL EXAM:  VITAL SIGNS: ED Triage Vitals [09/05/18 1746]  Enc Vitals Group     BP (!) 150/88     Pulse Rate 99     Resp 18     Temp 98.7 F (37.1 C)     Temp Source Oral     SpO2 99 %     Weight 130 lb (59 kg)     Height 5\' 3"  (1.6 m)     Head Circumference      Peak Flow      Pain Score 0     Pain Loc      Pain Edu?      Excl. in GC?     Constitutional: Alert and oriented.  Answers questions appropriately. Eyes: Conjunctivae are normal.  EOMI. No scleral icterus. Head: Atraumatic. Nose: No congestion/rhinnorhea. Mouth/Throat:  Mucous membranes are moist.  No swelling of the lips, tongue.  No trismus, stridor, drooling, or hoarse voice.  The posterior palate is symmetric and the uvula is midline.  There is no evidence of swelling inside the mouth. Neck: No stridor.  Supple.  No JVD.  No meningismus. Cardiovascular: Normal rate, regular rhythm. No murmurs, rubs or gallops.  Respiratory: Normal respiratory effort.  No accessory muscle use or retractions. Lungs CTAB.  No wheezes, rales or ronchi. Gastrointestinal: Soft, nontender and nondistended.  No guarding or rebound.  No peritoneal signs. Musculoskeletal: No LE edema.  Neurologic:  A&Ox3.  Speech is clear.  Face and smile are  symmetric.  EOMI.  Moves all extremities well. Skin: She has confluent flushing over the cheeks bilaterally with a urticarial rash over the neck and upper chest. Psychiatric: Mood and affect are normal. Speech and behavior are normal.  Normal judgement.  ____________________________________________   LABS (all labs ordered are listed, but only abnormal results are displayed)  Labs Reviewed - No data to display ____________________________________________  EKG  Not indicated ____________________________________________  RADIOLOGY  No results found.  ____________________________________________   PROCEDURES  Procedure(s) performed: None  Procedures  Critical Care performed: No ____________________________________________   INITIAL IMPRESSION / ASSESSMENT AND PLAN / ED COURSE  Pertinent labs & imaging results that were available during my care of the patient were reviewed by me and considered in my medical decision making (see chart for details).  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  Overall, the patient is hemodynamically stable.  At this time, her symptoms continue to improve.  I will add prednisone and Pepcid to her regimen for tonight, and then place her on a 5-day course of steroids and antihistamines.  I will re-prescribe an EpiPen to her, she does not have one here.  In addition, I will give her a prescription for an albuterol MDI.  We have had a long discussion about allergy precautions, immediate treatment, and when to call 911.  We also discussed rebound allergic reaction. follow-up instructions as well as return precautions were discussed.  ----------------------------------------- 7:55 PM on 09/05/2018 -----------------------------------------  During her clinical time here, the patient stated that she was developing worsening abdominal pain again, so I retreated her with Benadryl.  She had already  received prednisone and Pepcid.  Unfortunately, her hives worsened and on my reexamination, she had a urticarial rash on the arms, which was new and her face was becoming more flushed.  She continued to be hemodynamically stable without any compromise of her airway.  An EpiPen was ordered and the patient has an IV placed for further treatment as needed.  I will watch her for at least 3 hours for final disposition.  ____________________________________________  FINAL CLINICAL IMPRESSION(S) / ED DIAGNOSES  Final diagnoses:  Allergic reaction, initial encounter         NEW MEDICATIONS STARTED DURING THIS VISIT:  New Prescriptions   DIPHENHYDRAMINE (BENADRYL) 50 MG TABLET    Take 1 tablet (50 mg total) by mouth every 6 (six) hours as needed for itching or allergies.   EPINEPHRINE (EPIPEN 2-PAK) 0.3 MG/0.3 ML IJ SOAJ INJECTION    Inject 0.3 mLs (0.3 mg total) into the muscle as needed for anaphylaxis.   FAMOTIDINE (PEPCID) 20 MG TABLET    Take 1 tablet (20 mg total) by mouth 2 (two) times daily.   PREDNISONE (DELTASONE) 20 MG TABLET    Take 3 tablets (60  mg total) by mouth daily.      Rockne MenghiniNorman, Anne-Caroline, MD 09/05/18 Mikle [id number]    Rockne MenghiniNorman, Anne-Caroline, MD 09/05/18 [phone]

## 2018-09-05 NOTE — ED Triage Notes (Signed)
First Nurse Note:  States ate some sesame today at around 1430.  States has food allergy.  Took 50 mg benadryl and Zyrtec.  AAOx3.  Voice clear and strong.  States has had some coughing "fits".

## 2018-09-05 NOTE — ED Triage Notes (Signed)
Patient reports eating bread containing sesame 2 hours ago. 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 Patient complaining of flushing in face as well as cough and wheezing. Patient took 50 mg benadryl and 1 zyrtec prior to arrival. Patient speaking in complete sentences without distress. Dry cough noted. Lung sounds clear.

## 2018-09-05 NOTE — ED Notes (Signed)
Pt c/o "cold blue hand" on IV side. IV above this at ac. R pulse 2+; hand is slightly cooler than L; color appropriate. Pt had been dangling hand off bed in attempt to keep IV line straight. Pillow placed under R arm to help keep it straight. Pt denies pain.

## 2018-09-05 NOTE — ED Notes (Addendum)
Pt has slight inc in hives on arms and facial redness. Denies itchiness, pain, SOB, any tightness in throat or chest. Verb order for epi pen IV per EDP Sharma Covert.

## 2019-05-13 ENCOUNTER — Emergency Department
Admission: EM | Admit: 2019-05-13 | Discharge: 2019-05-13 | Disposition: A | Discharge status: Home / Self Care | Payer: BC Managed Care – PPO | Attending: Emergency Medicine | Admitting: Emergency Medicine

## 2019-05-13 ENCOUNTER — Other Ambulatory Visit: Payer: Self-pay

## 2019-05-13 ENCOUNTER — Encounter: Payer: Self-pay | Admitting: Emergency Medicine

## 2019-05-13 ENCOUNTER — Emergency Department: Payer: BC Managed Care – PPO

## 2019-05-13 DIAGNOSIS — Z79899 Other long term (current) drug therapy: Secondary | ICD-10-CM | POA: Insufficient documentation

## 2019-05-13 DIAGNOSIS — R1010 Upper abdominal pain, unspecified: Secondary | ICD-10-CM | POA: Insufficient documentation

## 2019-05-13 DIAGNOSIS — N12 Tubulo-interstitial nephritis, not specified as acute or chronic: Secondary | ICD-10-CM | POA: Diagnosis not present

## 2019-05-13 DIAGNOSIS — R300 Dysuria: Secondary | ICD-10-CM | POA: Diagnosis present

## 2019-05-13 LAB — CBC
HCT: 43.8 % (ref 36.0–46.0)
Hemoglobin: 15 g/dL (ref 12.0–15.0)
MCH: 31.3 pg (ref 26.0–34.0)
MCHC: 34.2 g/dL (ref 30.0–36.0)
MCV: 91.4 fL (ref 80.0–100.0)
Platelets: 300 10*3/uL (ref 150–400)
RBC: 4.79 MIL/uL (ref 3.87–5.11)
RDW: 12.1 % (ref 11.5–15.5)
WBC: 10.7 10*3/uL — ABNORMAL HIGH (ref 4.0–10.5)
nRBC: 0 % (ref 0.0–0.2)

## 2019-05-13 LAB — BASIC METABOLIC PANEL
Anion gap: 8 (ref 5–15)
BUN: 9 mg/dL (ref 6–20)
CO2: 27 mmol/L (ref 22–32)
Calcium: 9.6 mg/dL (ref 8.9–10.3)
Chloride: 104 mmol/L (ref 98–111)
Creatinine, Ser: 0.66 mg/dL (ref 0.44–1.00)
GFR calc Af Amer: >60 mL/min (ref >60)
GFR calc non Af Amer: >60 mL/min (ref >60)
Glucose, Bld: 112 mg/dL — ABNORMAL HIGH (ref 70–99)
Potassium: 4.1 mmol/L (ref 3.5–5.1)
Sodium: 139 mmol/L (ref 135–145)

## 2019-05-13 LAB — GLUCOSE, CAPILLARY: Glucose-Capillary: 96 mg/dL (ref 70–99)

## 2019-05-13 LAB — POCT PREGNANCY, URINE: Preg Test, Ur: NEGATIVE

## 2019-05-13 LAB — URINALYSIS, COMPLETE (UACMP) WITH MICROSCOPIC
Bilirubin Urine: NEGATIVE
Glucose, UA: NEGATIVE mg/dL
Ketones, ur: NEGATIVE mg/dL
Nitrite: NEGATIVE
Protein, ur: 100 mg/dL — AB
RBC / HPF: >50 RBC/hpf — ABNORMAL HIGH (ref 0–5)
Specific Gravity, Urine: 1.026 (ref 1.005–1.030)
WBC, UA: >50 WBC/hpf — ABNORMAL HIGH (ref 0–5)
pH: 5 (ref 5.0–8.0)

## 2019-05-13 MED ORDER — LEVOFLOXACIN IN D5W 750 MG/150ML IV SOLN
750.0000 mg | Freq: Once | INTRAVENOUS | Status: AC
  Administered 2019-05-13: 750 mg via INTRAVENOUS
  Filled 2019-05-13: qty 150

## 2019-05-13 MED ORDER — LEVOFLOXACIN 750 MG PO TABS: 750.0000 mg | ORAL_TABLET | Freq: Every day | ORAL | 0 refills | Status: AC

## 2019-05-13 NOTE — ED Triage Notes (Signed)
Pt in via POV, reports near syncopal episode while attending a zoom class, states, "My face went numb and everything went black."  A/Ox4, ambulatory without difficulty, vitals WDL.  NAD noted at this time.    Pt also reports some dysuria x one day, and noticing blood in urine.  Reports chronic UTI's.    Pt tearful in triage.

## 2019-05-13 NOTE — ED Notes (Signed)
Patient transported to CT 

## 2019-05-13 NOTE — ED Notes (Signed)
Pt reports dad and half sister both have aortic aneurysm.  Pt ambulatory NAD at this time.

## 2019-05-13 NOTE — ED Notes (Signed)
Pt asked to provide urine specimen, states she is unable to do so at this time.

## 2019-05-13 NOTE — ED Provider Notes (Signed)
[facility] Emergency Department Provider Note  ____________________________________________  Time seen: Approximately 5:02 PM  I have reviewed the triage vital signs and the nursing notes.   HISTORY  Chief Complaint Dysuria and Near Syncope    HPI Christine Farley is a 23 y.o. female presents to the emergency department with a presyncopal episode that occurred today while she was in [employer] class.  Patient states that she started having dysuria, hematuria and severe low back pain that started this morning when she awoke.  Patient states that she has a history of chronic cystitis so dysuria is not atypical for her.  She denies fever and chills but states that she started to become nauseated during her exercise class.  Patient states that she felt dizzy and saw black for approximately 20 seconds and then felt normal again.  She denies chest pain, chest tightness, shortness of breath or abdominal pain.  She reports that her low back pain has improved significantly while waiting in the emergency department.  She does report that she has a family history of aneurysms but states that she does not think symptoms are associated. No alleviating measures have been attempted.         Past Medical History:  Diagnosis Date  . Anxiety   . Depression   . Fainting episodes   . Kidney stone   . Recurrent UTI     There are no active problems to display for this patient.   Past Surgical History:  Procedure Laterality Date  . NO PAST SURGERIES      Prior to Admission medications   Medication Sig Start Date End Date Taking? Authorizing Provider  albuterol (PROAIR HFA) 108 (90 Base) MCG/ACT inhaler Inhale 2 puffs into the lungs every 4 (four) hours as needed for wheezing or shortness of breath. 09/05/18   Rockne MenghiniNorman, Anne-Caroline, MD  cephALEXin (KEFLEX) 250 MG capsule Take one capsule after intercourse 06/19/17   Michiel CowboyMcGowan, Shannon A, PA-C  diphenhydrAMINE (BENADRYL) 50 MG tablet  Take 1 tablet (50 mg total) by mouth every 6 (six) hours as needed for itching or allergies. 09/05/18   Rockne MenghiniNorman, Anne-Caroline, MD  EPINEPHrine (EPIPEN 2-PAK) 0.3 mg/0.3 mL IJ SOAJ injection Inject 0.3 mLs (0.3 mg total) into the muscle as needed for anaphylaxis. 09/05/18   Rockne MenghiniNorman, Anne-Caroline, MD  famotidine (PEPCID) 20 MG tablet Take 1 tablet (20 mg total) by mouth 2 (two) times daily. 09/05/18 09/05/19  Rockne MenghiniNorman, Anne-Caroline, MD  levofloxacin (LEVAQUIN) 750 MG tablet Take 1 tablet (750 mg total) by mouth daily for 5 days. 05/13/19 05/18/19  Orvil FeilWoods, Linley Moskal M, PA-C  nitrofurantoin, macrocrystal-monohydrate, (MACROBID) 100 MG capsule Take 1 capsule (100 mg total) by mouth every 12 (twelve) hours. 06/25/17   Michiel CowboyMcGowan, Shannon A, PA-C  oxybutynin (DITROPAN-XL) 10 MG 24 hr tablet Take 1 tablet (10 mg total) daily by mouth. Patient taking differently: Take 10 mg by mouth daily.  05/29/17   Michiel CowboyMcGowan, Shannon A, PA-C  predniSONE (DELTASONE) 20 MG tablet Take 3 tablets (60 mg total) by mouth daily. 09/05/18   Rockne MenghiniNorman, Anne-Caroline, MD  trimethoprim (TRIMPEX) 100 MG tablet Take 1 tablet (100 mg total) by mouth daily. 08/05/17   Alfredo MartinezMacDiarmid, Scott, MD    Allergies Nsaids  Family History  Problem Relation Age of Onset  . Healthy Father   . Healthy Mother   . Kidney cancer Neg Hx   . Bladder Cancer Neg Hx     Social History Social History   Tobacco Use  . Smoking  status: Never Smoker  . Smokeless tobacco: Never Used  Substance Use Topics  . Alcohol use: Yes  . Drug use: No     Review of Systems  Constitutional: No fever/chills Eyes: No visual changes. No discharge ENT: No upper respiratory complaints. Cardiovascular: no chest pain. Respiratory: no cough. No SOB. Gastrointestinal: No abdominal pain.  No nausea, no vomiting.  No diarrhea.  No constipation. Genitourinary: Patient has dysuria and hematuria. Patient has left sided flank pain.  Musculoskeletal: Negative for musculoskeletal  pain. Skin: Negative for rash, abrasions, lacerations, ecchymosis. Neurological: Negative for headaches, focal weakness or numbness.   ____________________________________________   PHYSICAL EXAM:  VITAL SIGNS: ED Triage Vitals  Enc Vitals Group     BP 05/13/19 1346 128/88     Pulse Rate 05/13/19 1346 78     Resp 05/13/19 1346 18     Temp 05/13/19 1346 98.2 F (36.8 C)     Temp Source 05/13/19 1346 Oral     SpO2 05/13/19 1346 100 %     Weight 05/13/19 1346 130 lb (59 kg)     Height 05/13/19 1346 5\' 3"  (1.6 m)     Head Circumference --      Peak Flow --      Pain Score 05/13/19 1357 8     Pain Loc --      Pain Edu? --      Excl. in GC? --      Constitutional: Alert and oriented. Well appearing and in no acute distress. Eyes: Conjunctivae are normal. PERRL. EOMI. Head: Atraumatic. ENT: Cardiovascular: Normal rate, regular rhythm. Normal S1 and S2.  Good peripheral circulation. Respiratory: Normal respiratory effort without tachypnea or retractions. Lungs CTAB. Good air entry to the bases with no decreased or absent breath sounds. Gastrointestinal: Bowel sounds 4 quadrants. Soft and nontender to palpation. No guarding or rigidity. No palpable masses. No distention. Patient had left sided CVA tenderness.  Musculoskeletal: Full range of motion to all extremities. No gross deformities appreciated. Neurologic:  Normal speech and language. No gross focal neurologic deficits are appreciated.  Skin:  Skin is warm, dry and intact. No rash noted. Psychiatric: Mood and affect are normal. Speech and behavior are normal. Patient exhibits appropriate insight and judgement.   ____________________________________________   LABS (all labs ordered are listed, but only abnormal results are displayed)  Labs Reviewed  BASIC METABOLIC PANEL - Abnormal; Notable for the following components:      Result Value   Glucose, Bld 112 (*)    All other components within normal limits  CBC -  Abnormal; Notable for the following components:   WBC 10.7 (*)    All other components within normal limits  URINALYSIS, COMPLETE (UACMP) WITH MICROSCOPIC - Abnormal; Notable for the following components:   Color, Urine YELLOW (*)    APPearance CLOUDY (*)    Hgb urine dipstick LARGE (*)    Protein, ur 100 (*)    Leukocytes,Ua MODERATE (*)    RBC / HPF >50 (*)    WBC, UA >50 (*)    Bacteria, UA RARE (*)    Non Squamous Epithelial PRESENT (*)    All other components within normal limits  URINE CULTURE  GLUCOSE, CAPILLARY  CBG MONITORING, ED  POC URINE PREG, ED  POCT PREGNANCY, URINE   ____________________________________________  EKG   ____________________________________________  RADIOLOGY I personally viewed and evaluated these images as part of my medical decision making, as well as reviewing the written report by the  radiologist.    Ct Renal Stone Study  Result Date: 05/13/2019 CLINICAL DATA:  Flank pain dysuria EXAM: CT ABDOMEN AND PELVIS WITHOUT CONTRAST TECHNIQUE: Multidetector CT imaging of the abdomen and pelvis was performed following the standard protocol without IV contrast. COMPARISON:  None. FINDINGS: Lower chest: The visualized heart size within normal limits. No pericardial fluid/thickening. No hiatal hernia. The visualized portions of the lungs are clear. Hepatobiliary: Although limited due to the lack of intravenous contrast, normal in appearance without gross focal abnormality. No evidence of calcified gallstones or biliary ductal dilatation. Pancreas:  Unremarkable.  No surrounding inflammatory changes. Spleen: Normal in size. Although limited due to the lack of intravenous contrast, normal in appearance. Adrenals/Urinary Tract: Both adrenal glands appear normal. The kidneys and collecting system appear normal without evidence of urinary tract calculus or hydronephrosis. Bladder is unremarkable. Stomach/Bowel: The stomach, small bowel, and colon are normal in  appearance. No inflammatory changes or obstructive findings. appendix is normal. Vascular/Lymphatic: There are no enlarged abdominal or pelvic lymph nodes. No significant gross vascular findings are present. Reproductive: The uterus and adnexa are unremarkable. IUD seen within the uterus. Small amount of free fluid in the cul-de-sac. Other: No evidence of abdominal wall mass or hernia. Musculoskeletal: No acute or significant osseous findings. IMPRESSION: No acute intra-abdominal pathology. Electronically Signed   By: Prudencio Pair M.D.   On: 05/13/2019 17:53    ____________________________________________    PROCEDURES  Procedure(s) performed:    Procedures    Medications  levofloxacin (LEVAQUIN) IVPB 750 mg (0 mg Intravenous Stopped 05/13/19 1907)     ____________________________________________   INITIAL IMPRESSION / ASSESSMENT AND PLAN / ED COURSE  Pertinent labs & imaging results that were available during my care of the patient were reviewed by me and considered in my medical decision making (see chart for details).  Review of the [place] CSRS was performed in accordance of the [place] prior to dispensing any controlled drugs.         Assessment and Plan:  Pyelonephritis 23 year old female presents to the emergency department with left-sided low back pain, nausea, dysuria and hematuria that started today.  Vital signs are reassuring at triage.  Patient had left-sided CVA tenderness on exam.  Differential diagnosis included pyelonephritis, nephrolithiasis, cystitis, pregnancy...  Urinalysis was concerning for cystitis.  CT renal stone study revealed no evidence of nephrolithiasis.  Patient was given IV Levaquin in the emergency department and discharged with Levaquin.  Urine culture is pending.  Strict return precautions were given to return to the emergency department with worsening nausea, vomiting or low back pain.  She voiced understanding and has easy access to the ED  should symptoms worsen.   ____________________________________________  FINAL CLINICAL IMPRESSION(S) / ED DIAGNOSES  Final diagnoses:  Pyelonephritis      NEW MEDICATIONS STARTED DURING THIS VISIT:  ED Discharge Orders         Ordered    levofloxacin (LEVAQUIN) 750 MG tablet  Daily     05/13/19 1911              This chart was dictated using voice recognition software/Dragon. Despite best efforts to proofread, errors can occur which can change the meaning. Any change was purely unintentional.    Lannie Fields, PA-C 05/13/19 1944    Nance Pear, MD 05/13/19 2308

## 2019-05-13 NOTE — ED Notes (Signed)
Pt back from CT

## 2019-05-14 LAB — URINE CULTURE

## 2019-08-18 ENCOUNTER — Emergency Department
Admission: EM | Admit: 2019-08-18 | Discharge: 2019-08-18 | Disposition: A | Discharge status: Home / Self Care | Payer: BC Managed Care – PPO | Attending: Emergency Medicine | Admitting: Emergency Medicine

## 2019-08-18 ENCOUNTER — Other Ambulatory Visit: Payer: Self-pay

## 2019-08-18 ENCOUNTER — Encounter: Payer: Self-pay | Admitting: Emergency Medicine

## 2019-08-18 DIAGNOSIS — R109 Unspecified abdominal pain: Secondary | ICD-10-CM | POA: Diagnosis not present

## 2019-08-18 DIAGNOSIS — Z87440 Personal history of urinary (tract) infections: Secondary | ICD-10-CM | POA: Diagnosis not present

## 2019-08-18 DIAGNOSIS — Z87442 Personal history of urinary calculi: Secondary | ICD-10-CM | POA: Insufficient documentation

## 2019-08-18 DIAGNOSIS — R112 Nausea with vomiting, unspecified: Secondary | ICD-10-CM

## 2019-08-18 DIAGNOSIS — R000 Tachycardia, unspecified: Secondary | ICD-10-CM

## 2019-08-18 HISTORY — DX: Other chronic cystitis without hematuria: N30.20

## 2019-08-18 LAB — COMPREHENSIVE METABOLIC PANEL
ALT: 16 U/L (ref 0–44)
AST: 18 U/L (ref 15–41)
Albumin: 4.5 g/dL (ref 3.5–5.0)
Alkaline Phosphatase: 39 U/L (ref 38–126)
Anion gap: 10 (ref 5–15)
BUN: 11 mg/dL (ref 6–20)
CO2: 24 mmol/L (ref 22–32)
Calcium: 9.2 mg/dL (ref 8.9–10.3)
Chloride: 107 mmol/L (ref 98–111)
Creatinine, Ser: 0.65 mg/dL (ref 0.44–1.00)
GFR calc Af Amer: >60 mL/min (ref >60)
GFR calc non Af Amer: >60 mL/min (ref >60)
Glucose, Bld: 126 mg/dL — ABNORMAL HIGH (ref 70–99)
Potassium: 3.6 mmol/L (ref 3.5–5.1)
Sodium: 141 mmol/L (ref 135–145)
Total Bilirubin: 0.9 mg/dL (ref 0.3–1.2)
Total Protein: 7.1 g/dL (ref 6.5–8.1)

## 2019-08-18 LAB — URINALYSIS, COMPLETE (UACMP) WITH MICROSCOPIC
Bacteria, UA: NONE SEEN
Bilirubin Urine: NEGATIVE
Glucose, UA: NEGATIVE mg/dL
Hgb urine dipstick: NEGATIVE
Ketones, ur: 5 mg/dL — AB
Leukocytes,Ua: NEGATIVE
Nitrite: NEGATIVE
Protein, ur: NEGATIVE mg/dL
Specific Gravity, Urine: 1.025 (ref 1.005–1.030)
pH: 5 (ref 5.0–8.0)

## 2019-08-18 LAB — CBC WITH DIFFERENTIAL/PLATELET
Abs Immature Granulocytes: 0.06 10*3/uL (ref 0.00–0.07)
Basophils Absolute: 0 10*3/uL (ref 0.0–0.1)
Basophils Relative: 0 %
Eosinophils Absolute: 0.1 10*3/uL (ref 0.0–0.5)
Eosinophils Relative: 1 %
HCT: 42.4 % (ref 36.0–46.0)
Hemoglobin: 14.3 g/dL (ref 12.0–15.0)
Immature Granulocytes: 0 %
Lymphocytes Relative: 5 %
Lymphs Abs: 0.7 10*3/uL (ref 0.7–4.0)
MCH: 30.5 pg (ref 26.0–34.0)
MCHC: 33.7 g/dL (ref 30.0–36.0)
MCV: 90.4 fL (ref 80.0–100.0)
Monocytes Absolute: 0.9 10*3/uL (ref 0.1–1.0)
Monocytes Relative: 7 %
Neutro Abs: 11.8 10*3/uL — ABNORMAL HIGH (ref 1.7–7.7)
Neutrophils Relative %: 87 %
Platelets: 236 10*3/uL (ref 150–400)
RBC: 4.69 MIL/uL (ref 3.87–5.11)
RDW: 12.2 % (ref 11.5–15.5)
WBC: 13.6 10*3/uL — ABNORMAL HIGH (ref 4.0–10.5)
nRBC: 0 % (ref 0.0–0.2)

## 2019-08-18 LAB — LACTIC ACID, PLASMA: Lactic Acid, Venous: 0.9 mmol/L (ref 0.5–1.9)

## 2019-08-18 LAB — LIPASE, BLOOD: Lipase: 31 U/L (ref 11–51)

## 2019-08-18 LAB — POCT PREGNANCY, URINE: Preg Test, Ur: NEGATIVE

## 2019-08-18 LAB — PROCALCITONIN: Procalcitonin: <0.1 ng/mL

## 2019-08-18 MED ORDER — DROPERIDOL 2.5 MG/ML IJ SOLN
2.5000 mg | Freq: Once | INTRAMUSCULAR | Status: AC
  Administered 2019-08-18: 07:00:00 2.5 mg via INTRAVENOUS
  Filled 2019-08-18: qty 2

## 2019-08-18 MED ORDER — ONDANSETRON 4 MG PO TBDP: ORAL_TABLET | ORAL | 0 refills | Status: AC

## 2019-08-18 MED ORDER — SODIUM CHLORIDE 0.9 % IV BOLUS
1000.0000 mL | Freq: Once | INTRAVENOUS | Status: AC
  Administered 2019-08-18: 1000 mL via INTRAVENOUS

## 2019-08-18 MED ORDER — LACTATED RINGERS IV BOLUS
1000.0000 mL | Freq: Once | INTRAVENOUS | Status: AC
  Administered 2019-08-18: 1000 mL via INTRAVENOUS

## 2019-08-18 MED ORDER — ONDANSETRON HCL 4 MG/2ML IJ SOLN
4.0000 mg | INTRAMUSCULAR | Status: AC
  Administered 2019-08-18: 4 mg via INTRAVENOUS
  Filled 2019-08-18: qty 2

## 2019-08-18 NOTE — ED Notes (Signed)
Pt reports nausea better. No needs at this time.

## 2019-08-18 NOTE — ED Provider Notes (Signed)
Patient reports she is feeling better, she does have periods of tachycardia for which I will refer her to cardiology for possible Holter monitoring.   Emily Filbert, MD 08/18/19 [id number]

## 2019-08-18 NOTE — ED Provider Notes (Signed)
[facility] Emergency Department Provider Note  ____________________________________________   First MD Initiated Contact with Patient 08/18/19 0406     (approximate)  I have reviewed the triage vital signs and the nursing notes.   HISTORY  Chief Complaint Emesis    HPI Christine Farley is a 24 y.o. 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  She presents tonight for evaluation of acute onset severe nausea and vomiting.  She said that she knew she was going to be sick when she went to sleep tonight because she had some intermittent achy abdominal pain that feels similar to when she was previously diagnosed with "the stomach flu".  She slept for about 2 hours and then woke up feeling severely nauseated and had a large volume emesis almost immediately.  She was able to rest again for about an hour but then had another episode of vomiting at about 3:30 AM.  She has persistent nausea as it is severe nothing clinically makes it better or worse.  She has intermittent "pangs" in her stomach that she describes as sharp and intense and occurs all over her abdomen rather than at one particular location.  She describes this as almost not sure whether it feels like a severe hunger pang or something different.  She had no known bad food exposure.  She denies fever/chills, sore throat, chest pain, cough, shortness of breath.  She has not had any dysuria recently although it is not unusual for her to have dysuria due to her chronic cystitis.  She has been having a lower back pain on both sides which she associates with kidney infections.  She was last seen in this emergency department about 3 to 4 months ago and diagnosed clinically with pyelonephritis and treated  with Levaquin.        Past Medical History:  Diagnosis  Date  . Anxiety   . Chronic cystitis   . Depression   . Fainting episodes   . Kidney stone   . Recurrent UTI   . Recurrent UTI     There are no problems to display for this patient.   Past Surgical History:  Procedure Laterality Date  . NO PAST SURGERIES      Prior to Admission medications   Medication Sig Start Date End Date Taking? Authorizing Provider  albuterol (PROAIR HFA) 108 (90 Base) MCG/ACT inhaler Inhale 2 puffs into the lungs every 4 (four) hours as needed for wheezing or shortness of breath. 09/05/18   Rockne Menghini, MD  cephALEXin (KEFLEX) 250 MG capsule Take one capsule after intercourse 06/19/17   Michiel Cowboy A, PA-C  diphenhydrAMINE (BENADRYL) 50 MG tablet Take 1 tablet (50 mg total) by mouth every 6 (six) hours as needed for itching or allergies. 09/05/18   Rockne Menghini, MD  EPINEPHrine (EPIPEN 2-PAK) 0.3 mg/0.3 mL IJ SOAJ injection Inject 0.3 mLs (0.3 mg total) into the muscle as needed for anaphylaxis. 09/05/18   Rockne Menghini, MD  famotidine (PEPCID) 20 MG tablet Take 1 tablet (20 mg total) by mouth 2 (two) times daily. 09/05/18 09/05/19  Rockne Menghini, MD  nitrofurantoin, macrocrystal-monohydrate, (MACROBID) 100 MG capsule Take 1 capsule (100 mg total) by mouth every 12 (twelve) hours. 06/25/17   Michiel Cowboy A, PA-C  ondansetron (ZOFRAN ODT) 4 MG disintegrating tablet Allow 1-2 tablets to  dissolve in your mouth every 8 hours as needed for nausea/vomiting 08/18/19   Loleta Rose, MD  oxybutynin (DITROPAN-XL) 10 MG 24 hr tablet Take 1 tablet (10 mg total) daily by mouth. Patient taking differently: Take 10 mg by mouth daily.  05/29/17   Michiel Cowboy A, PA-C  predniSONE (DELTASONE) 20 MG tablet Take 3 tablets (60 mg total) by mouth daily. 09/05/18   Rockne Menghini, MD  trimethoprim (TRIMPEX) 100 MG tablet Take 1 tablet (100 mg total) by mouth daily. 08/05/17   Alfredo Martinez, MD    Allergies Nsaids  Family History    Problem Relation Age of Onset  . Healthy Father   . Healthy Mother   . Kidney cancer Neg Hx   . Bladder Cancer Neg Hx     Social History Social History   Tobacco Use  . Smoking status: Never Smoker  . Smokeless tobacco: Never Used  Substance Use Topics  . Alcohol use: Yes  . Drug use: No    Review of Systems Constitutional: No fever/chills Eyes: No visual changes. ENT: No sore throat. Cardiovascular: Denies chest pain. Respiratory: Denies shortness of breath. Gastrointestinal: Severe and acute onset nausea and vomiting with intermittent abdominal pain. Genitourinary: Questionable dysuria with a history of chronic cystitis and recurrent UTIs. Musculoskeletal: Gradually worsening bilateral low back pain as described above Integumentary: Negative for rash. Neurological: Negative for headaches, focal weakness or numbness.   ____________________________________________   PHYSICAL EXAM:  VITAL SIGNS: ED Triage Vitals  Enc Vitals Group     BP 08/18/19 0405 110/80     Pulse Rate 08/18/19 0405 (!) 117     Resp 08/18/19 0405 18     Temp 08/18/19 0405 97.7 F (36.5 C)     Temp src --      SpO2 08/18/19 0405 98 %     Weight 08/18/19 0403 58.1 kg (128 lb)     Height 08/18/19 0403 1.6 m (5\' 3" )     Head Circumference --      Peak Flow --      Pain Score 08/18/19 0403 0     Pain Loc --      Pain Edu? --      Excl. in GC? --     Constitutional: Alert and oriented.  Appears uncomfortable. Eyes: Conjunctivae are normal.  Head: Atraumatic. Nose: No congestion/rhinnorhea. Mouth/Throat: Patient is wearing a mask. Neck: No stridor.  No meningeal signs.   Cardiovascular: Widely variable sinus tachycardia ranging from about 100 to the mid 130s depending on whether she is moving in her position, regular rhythm. Good peripheral circulation. Grossly normal heart sounds. Respiratory: Normal respiratory effort.  No retractions. Gastrointestinal: Soft and nontender. No distention.   Genitourinary: Deferred Musculoskeletal: No lower extremity tenderness nor edema. No gross deformities of extremities. Neurologic:  Normal speech and language. No gross focal neurologic deficits are appreciated.  Skin:  Skin is warm, dry and intact. Psychiatric: Mood and affect are normal. Speech and behavior are normal.  ____________________________________________   LABS (all labs ordered are listed, but only abnormal results are displayed)  Labs Reviewed  URINALYSIS, COMPLETE (UACMP) WITH MICROSCOPIC - Abnormal; Notable for the following components:      Result Value   Color, Urine YELLOW (*)    APPearance HAZY (*)    Ketones, ur 5 (*)    All other components within normal limits  COMPREHENSIVE METABOLIC PANEL - Abnormal; Notable for the following components:   Glucose, Bld 126 (*)  All other components within normal limits  CBC WITH DIFFERENTIAL/PLATELET - Abnormal; Notable for the following components:   WBC 13.6 (*)    Neutro Abs 11.8 (*)    All other components within normal limits  URINE CULTURE  LACTIC ACID, PLASMA  LIPASE, BLOOD  PROCALCITONIN  POC URINE PREG, ED   ____________________________________________  EKG  None - EKG not ordered by ED physician ____________________________________________  RADIOLOGY Marylou Mccoy, personally viewed and evaluated these images (plain radiographs) as part of my medical decision making, as well as reviewing the written report by the radiologist.  ED MD interpretation: No indication for emergent imaging  Official radiology report(s): No results found.  ____________________________________________   PROCEDURES   Procedure(s) performed (including Critical Care):  Procedures   ____________________________________________   INITIAL IMPRESSION / MDM / ASSESSMENT AND PLAN / ED COURSE  As part of my medical decision making, I reviewed the following data within the electronic MEDICAL RECORD NUMBER Nursing notes  reviewed and incorporated, Labs reviewed , EKG interpreted , Old chart reviewed, Patient signed out to Dr. Mayford Knife and Notes from prior ED visits   Differential diagnosis includes, but is not limited to, viral gastritis/gastroenteritis (although she is not having diarrhea), other nonspecific foodborne pathogen, other nonspecific gastritis, SBO/ileus, appendicitis, biliary disease, renal/ureteral colic, UTI/pyonephritis, STD/PID/TOA.  Patient is tachycardic and obviously uncomfortable.  I am ordering 1 L normal saline and 4 mg of Zofran and I will reassess whether she needs additional antiemetics.  She is not having pain at the moment so we will hold off on analgesia.  Lab work is pending including lactic acid and procalcitonin as well as a metabolic panel  and CBC.  I have ordered a urinalysis to compare against her prior urinalyses.  I checked on her prior urine cultures and the most recent one from October demonstrated multiple species and they recommended recollection.  Prior urine culture demonstrated E. coli.  The patient is an established patient of Michiel Cowboy at [facility].   Clinical Course as of Aug 17 714  Tue Aug 18, 2019  9702 The patient has mild leukocytosis but her CMP is normal and her lipase is normal.  She has not yet provided a urine specimen.  She is reportedly feeling better after the Zofran.   [CF]  [phone] Patient reports that she is again feeling nauseated and she is having some pain but this time it is in the left side of her lower rib cage.  I think that she has pulled some muscles from vomiting.  She has some tenderness to palpation of the anterior lower rib cage but upon reassessment she has no tenderness to palpation of her abdomen.  She is obviously anxious and concerned about the symptoms and when I went in to talk with her her heart rate jumped back up into the 110s although he did been down in the 90s previously.  We discussed imaging but  agreed that given that she is not having any abdominal pain or tenderness at this time, she does not need a CT scan.  For her discomfort I am going to give droperidol 2.5 mg IV (for the refractory nausea as well as the pain and anxiety component) and 1 more liter of fluids given that she still has a labile heart rate prior to tachycardia.  I will transfer care to Dr. Mayford Knife at 7 AM to reassess after treatment and see how she is feeling.   [CF]  [phone] Of note, her  urinalysis is reassuring with only few squamous cells and a few ketones.  No indication for empiric antibiotics and no evidence of UTI or pyelonephritis at this time.  Lactic acid was within normal limits.  Procalcitonin is still pending.   [CF]  [id number] Of note, shortly after I left the room, the patient's heart rate was observed to go up into the 160s although from a distance on the monitor it appeared to be a sinus rhythm.  Her nurse went in to obtain an EKG and found that the patient was sobbing and appeared to be having a panic or anxiety attack.  He coached her through it and her heart rate came down to about 110 at this time which is consistent with what it has been previously tonight.  She says she is just worried about the pain in her back and the discomfort from the nausea and vomiting.  I went over her results again and provided reassurance.  She has received the droperidol 2.5 mg IV and will be getting another liter of fluid.  I think this will help resolve her symptoms including the anxiety and I still anticipate discharge with outpatient follow-up after reassessment.   [CF]    Clinical Course User Index [CF] Hinda Kehr, MD    ____________________________________________  FINAL CLINICAL IMPRESSION(S) / ED DIAGNOSES  Final diagnoses:  Non-intractable vomiting with nausea, unspecified vomiting type     MEDICATIONS GIVEN DURING THIS VISIT:  Medications  ondansetron (ZOFRAN) injection 4 mg (4 mg Intravenous Given 08/18/19  0424)  sodium chloride 0.9 % bolus 1,000 mL (0 mLs Intravenous Stopped 08/18/19 0532)  droperidol (INAPSINE) 2.5 MG/ML injection 2.5 mg (2.5 mg Intravenous Given 08/18/19 0656)  lactated ringers bolus 1,000 mL (1,000 mLs Intravenous New Bag/Given 08/18/19 0656)     ED Discharge Orders         Ordered    ondansetron (ZOFRAN ODT) 4 MG disintegrating tablet     08/18/19 2094          *Please note:  Chalene B Foxworthy was evaluated in Emergency Department on 08/18/2019 for the symptoms described in the history of present illness. She was evaluated in the context of the global COVID-19 pandemic, which necessitated consideration that the patient might be at risk for infection with the SARS-CoV-2 virus that causes COVID-19. Institutional protocols and algorithms that pertain to the evaluation of patients at risk for COVID-19 are in a state of rapid change based on information released by regulatory bodies including the CDC and federal and state organizations. These policies and algorithms were followed during the patient's care in the ED.  Some ED evaluations and interventions may be delayed as a result of limited staffing during the pandemic.*  Note:  This document was prepared using Dragon voice recognition software and may include unintentional dictation errors.   Hinda Kehr, MD 08/18/19 [phone]

## 2019-08-18 NOTE — Discharge Instructions (Addendum)

## 2019-08-18 NOTE — ED Notes (Signed)
Pt reports feeling "a little less" nauseous after Zofran administration at 04:24am. Pt requesting and given a cup of ice water at this time, per verbal ok by Dr York Cerise, to assess pt's ability to tolerate PO intake.

## 2019-08-18 NOTE — ED Triage Notes (Signed)
Patient ambulatory to triage with steady gait, without difficulty or distress noted, mask in place; pt reports N/V since 230am

## 2019-08-19 LAB — URINE CULTURE
Culture: <10000 — AB
Special Requests: NORMAL

## 2021-01-02 IMAGING — CT CT RENAL STONE PROTOCOL
3 of 4 series · 8 of 46 positions shown, 15 images · non-contrast
Comparison: None.

CLINICAL DATA: Flank pain dysuria

EXAM:
CT ABDOMEN AND PELVIS WITHOUT CONTRAST
TECHNIQUE: Multidetector CT imaging of the abdomen and pelvis was performed
following the standard protocol without IV contrast.

[Series 4: lung bases · axial · 0.70mm/px · z∈[-128,-68]mm · 4 of 20 slices shown, 9 images]
[im 4/20  soft-tissue]
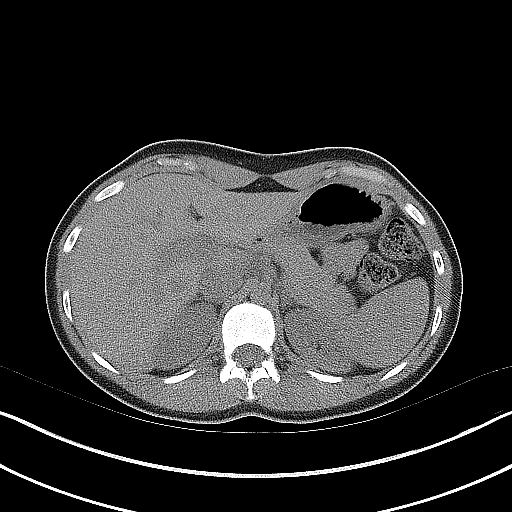
[im 4/20  lung]
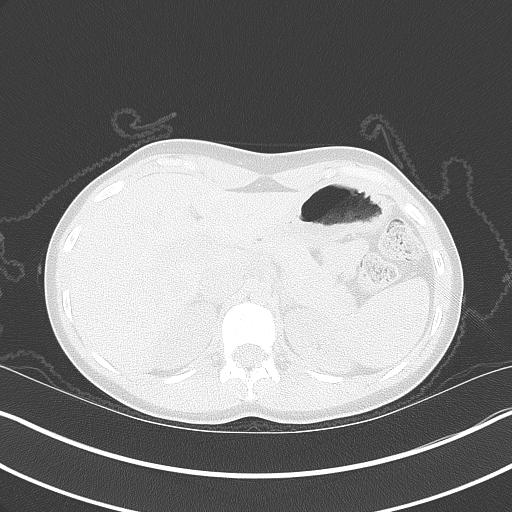
[im 4/20  bone]
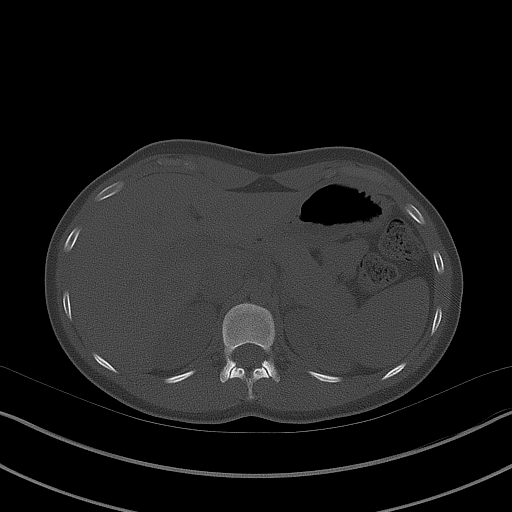
[im 8/20  soft-tissue]
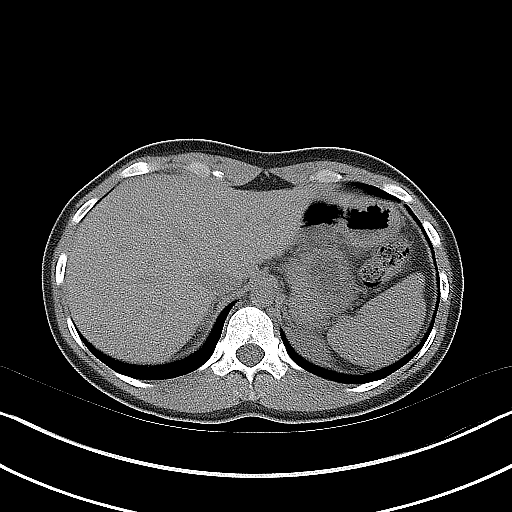
[im 8/20  lung]
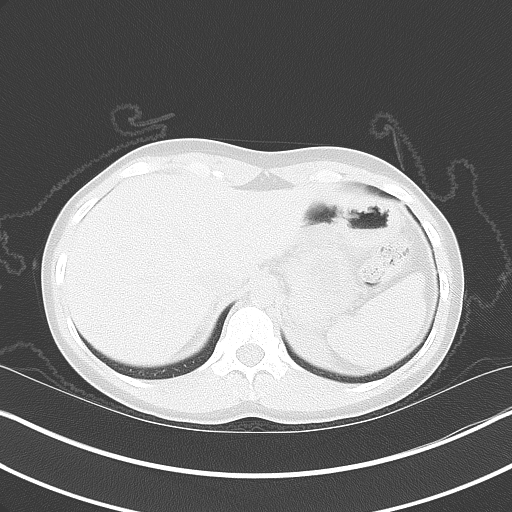
[im 12/20  soft-tissue]
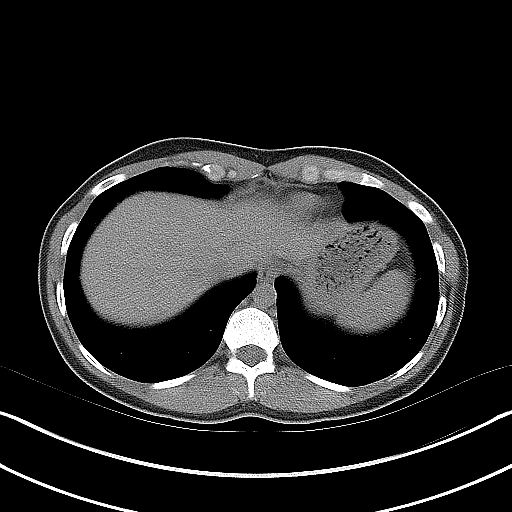
[im 12/20  lung]
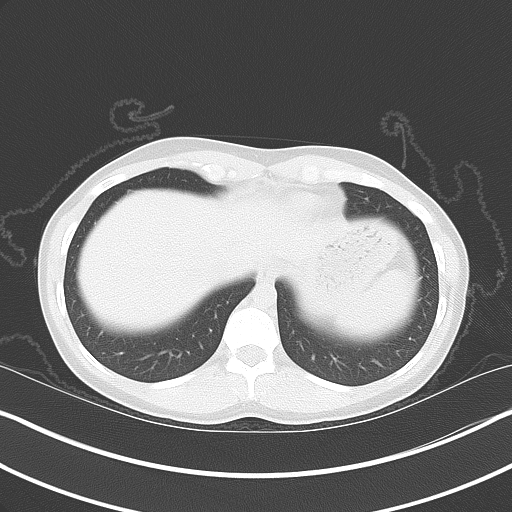
[im 16/20  soft-tissue]
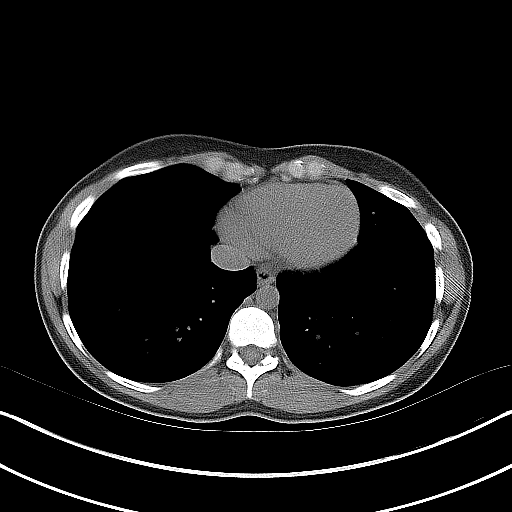
[im 16/20  lung]
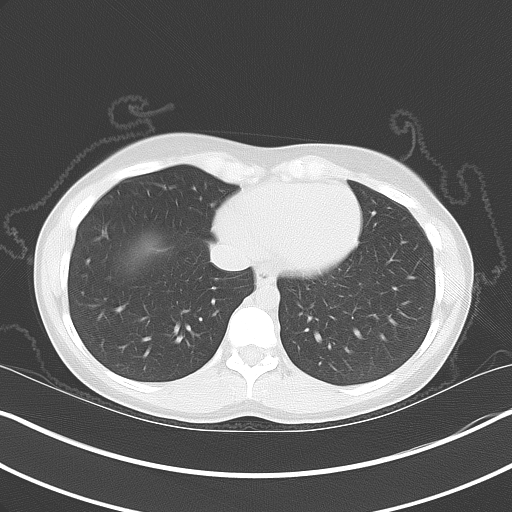

[Series 5: coronal · coronal · 0.73mm/px · 3 of 102 slices shown, 4 images]
[im 34/102  soft-tissue]
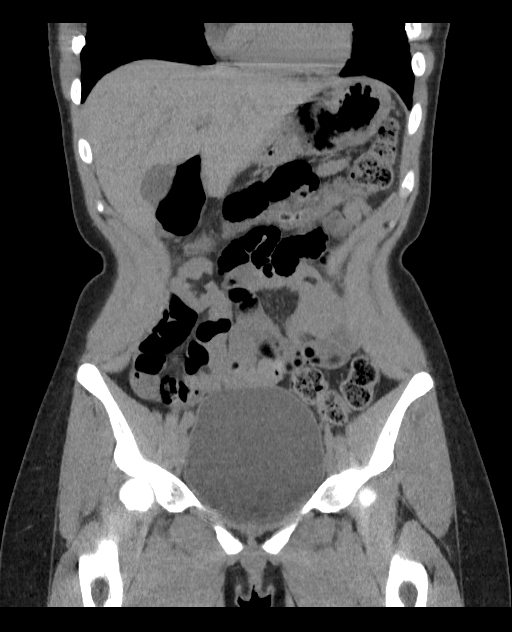
[im 45/102  soft-tissue]
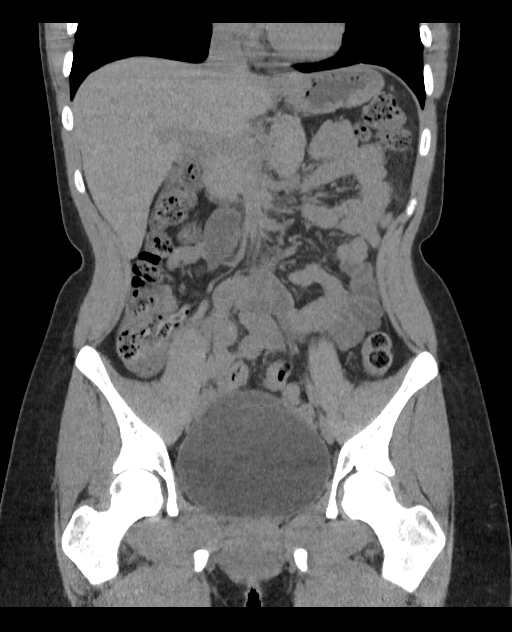
[im 45/102  bone]
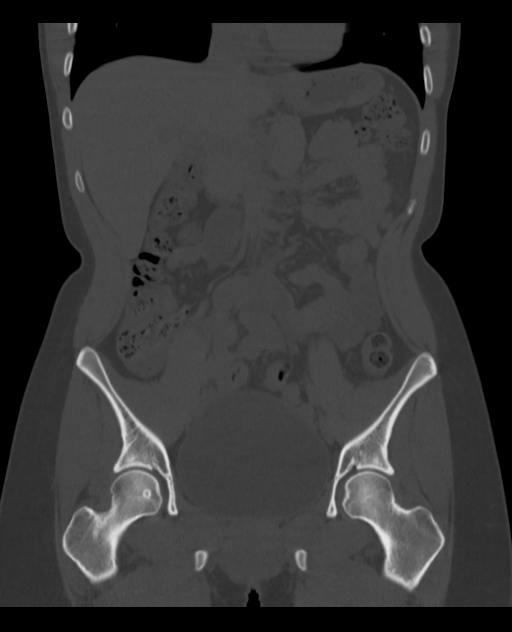
[im 57/102  soft-tissue]
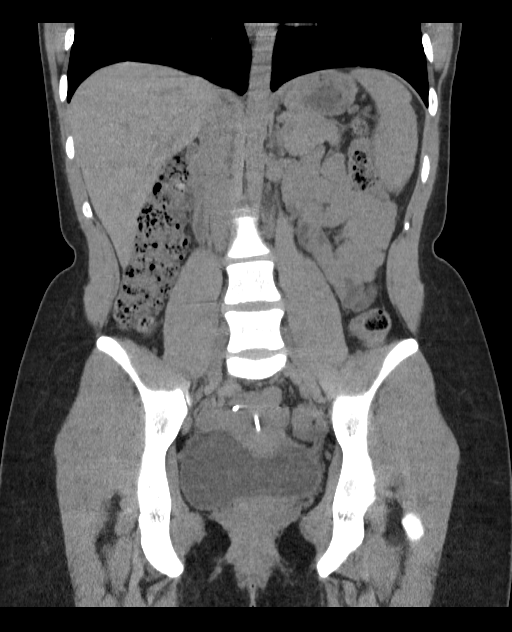

[Series 6: sagittal · sagittal · 0.53mm/px · 1 of 156 slices shown, 2 images]
[im 52/156  soft-tissue]
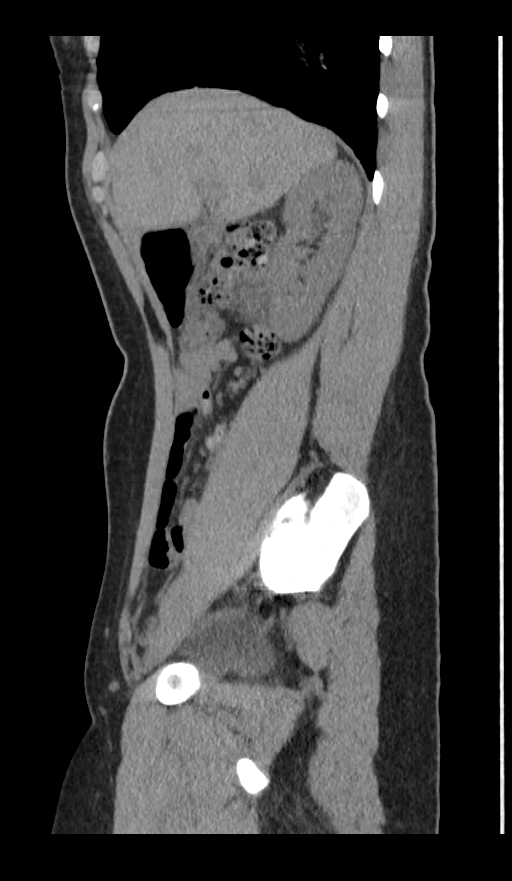
[im 52/156  bone]
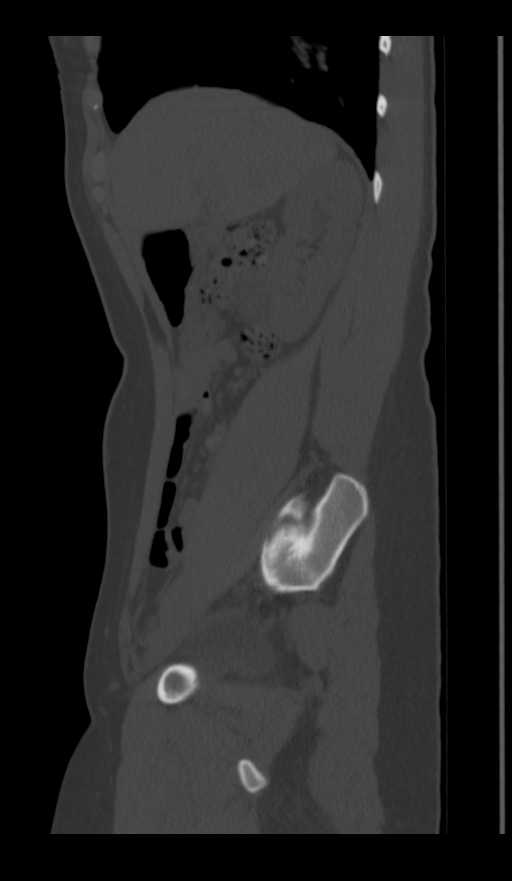

[8 of 46 positions shown; findings below may reference images not displayed]

FINDINGS: Lower chest: The visualized heart size within normal limits. No
pericardial fluid/thickening.

No hiatal hernia.

The visualized portions of the lungs are clear.

Hepatobiliary: Although limited due to the lack of intravenous
contrast, normal in appearance without gross focal abnormality. No
evidence of calcified gallstones or biliary ductal dilatation.

Pancreas:  Unremarkable.  No surrounding inflammatory changes.

Spleen: Normal in size. Although limited due to the lack of
intravenous contrast, normal in appearance.

Adrenals/Urinary Tract: Both adrenal glands appear normal. The
kidneys and collecting system appear normal without evidence of
urinary tract calculus or hydronephrosis. Bladder is unremarkable.

Stomach/Bowel: The stomach, small bowel, and colon are normal in
appearance. No inflammatory changes or obstructive findings.
appendix is normal.

Vascular/Lymphatic: There are no enlarged abdominal or pelvic lymph
nodes. No significant gross vascular findings are present.

Reproductive: The uterus and adnexa are unremarkable. IUD seen
within the uterus. Small amount of free fluid in the cul-de-sac.

Other: No evidence of abdominal wall mass or hernia.

Musculoskeletal: No acute or significant osseous findings.
IMPRESSION: No acute intra-abdominal pathology.
